# Patient Record
Sex: Female | Born: 1980 | Race: Black or African American | Hispanic: No | Marital: Married | Smoking: Never smoker
Health system: Southern US, Community
[De-identification: ages and names within clinical notes are randomized; demographics above are authoritative.]

## PROBLEM LIST (undated history)

## (undated) DIAGNOSIS — D649 Anemia, unspecified: Secondary | ICD-10-CM

## (undated) HISTORY — DX: Anemia, unspecified: D64.9

## (undated) HISTORY — PX: OTHER SURGICAL HISTORY: SHX169

## (undated) HISTORY — PX: TUBAL LIGATION: SHX77

---

## 2001-08-28 ENCOUNTER — Other Ambulatory Visit: Admission: RE | Admit: 2001-08-28 | Discharge: 2001-08-28 | Payer: Self-pay | Admitting: Gynecology

## 2003-01-08 ENCOUNTER — Other Ambulatory Visit: Admission: RE | Admit: 2003-01-08 | Discharge: 2003-01-08 | Payer: Self-pay | Admitting: Gynecology

## 2003-03-07 ENCOUNTER — Inpatient Hospital Stay (HOSPITAL_COMMUNITY): Admission: AD | Admit: 2003-03-07 | Discharge: 2003-03-07 | Payer: Self-pay | Admitting: Gynecology

## 2003-07-25 ENCOUNTER — Inpatient Hospital Stay (HOSPITAL_COMMUNITY): Admission: AD | Admit: 2003-07-25 | Discharge: 2003-07-29 | Payer: Self-pay | Admitting: Gynecology

## 2003-09-09 ENCOUNTER — Other Ambulatory Visit: Admission: RE | Admit: 2003-09-09 | Discharge: 2003-09-09 | Payer: Self-pay | Admitting: Gynecology

## 2009-10-18 HISTORY — PX: OTHER SURGICAL HISTORY: SHX169

## 2014-03-26 ENCOUNTER — Encounter: Payer: Self-pay | Admitting: Women's Health

## 2014-03-26 ENCOUNTER — Ambulatory Visit (INDEPENDENT_AMBULATORY_CARE_PROVIDER_SITE_OTHER): Payer: BC Managed Care – PPO | Admitting: Women's Health

## 2014-03-26 ENCOUNTER — Other Ambulatory Visit (HOSPITAL_COMMUNITY)
Admission: RE | Admit: 2014-03-26 | Discharge: 2014-03-26 | Disposition: A | Discharge status: Home / Self Care | Payer: BC Managed Care – PPO | Source: Ambulatory Visit | Attending: Women's Health | Admitting: Women's Health

## 2014-03-26 VITALS — BP 130/84 | Ht 65.5 in | Wt 205.0 lb

## 2014-03-26 DIAGNOSIS — Z833 Family history of diabetes mellitus: Secondary | ICD-10-CM

## 2014-03-26 DIAGNOSIS — Z1151 Encounter for screening for human papillomavirus (HPV): Secondary | ICD-10-CM | POA: Insufficient documentation

## 2014-03-26 DIAGNOSIS — A499 Bacterial infection, unspecified: Secondary | ICD-10-CM

## 2014-03-26 DIAGNOSIS — Z01419 Encounter for gynecological examination (general) (routine) without abnormal findings: Secondary | ICD-10-CM

## 2014-03-26 DIAGNOSIS — Z13220 Encounter for screening for lipoid disorders: Secondary | ICD-10-CM

## 2014-03-26 DIAGNOSIS — R87810 Cervical high risk human papillomavirus (HPV) DNA test positive: Secondary | ICD-10-CM | POA: Insufficient documentation

## 2014-03-26 DIAGNOSIS — B373 Candidiasis of vulva and vagina: Secondary | ICD-10-CM

## 2014-03-26 DIAGNOSIS — B3749 Other urogenital candidiasis: Secondary | ICD-10-CM

## 2014-03-26 DIAGNOSIS — B9689 Other specified bacterial agents as the cause of diseases classified elsewhere: Secondary | ICD-10-CM

## 2014-03-26 DIAGNOSIS — Z9851 Tubal ligation status: Secondary | ICD-10-CM

## 2014-03-26 DIAGNOSIS — N760 Acute vaginitis: Secondary | ICD-10-CM

## 2014-03-26 DIAGNOSIS — B3731 Acute candidiasis of vulva and vagina: Secondary | ICD-10-CM

## 2014-03-26 DIAGNOSIS — B3741 Candidal cystitis and urethritis: Secondary | ICD-10-CM

## 2014-03-26 DIAGNOSIS — E079 Disorder of thyroid, unspecified: Secondary | ICD-10-CM

## 2014-03-26 LAB — CBC WITH DIFFERENTIAL/PLATELET
BASOS PCT: 0 % (ref 0–1)
Basophils Absolute: 0 10*3/uL (ref 0.0–0.1)
Eosinophils Absolute: 0.1 10*3/uL (ref 0.0–0.7)
Eosinophils Relative: 1 % (ref 0–5)
HEMATOCRIT: 30.1 % — AB (ref 36.0–46.0)
Hemoglobin: 9.3 g/dL — ABNORMAL LOW (ref 12.0–15.0)
Lymphocytes Relative: 31 % (ref 12–46)
Lymphs Abs: 2.4 10*3/uL (ref 0.7–4.0)
MCH: 20.2 pg — ABNORMAL LOW (ref 26.0–34.0)
MCHC: 30.9 g/dL (ref 30.0–36.0)
MCV: 65.4 fL — ABNORMAL LOW (ref 78.0–100.0)
MONO ABS: 0.5 10*3/uL (ref 0.1–1.0)
Monocytes Relative: 7 % (ref 3–12)
NEUTROS ABS: 4.6 10*3/uL (ref 1.7–7.7)
Neutrophils Relative %: 61 % (ref 43–77)
PLATELETS: 333 10*3/uL (ref 150–400)
RBC: 4.6 MIL/uL (ref 3.87–5.11)
RDW: 20.9 % — AB (ref 11.5–15.5)
WBC: 7.6 10*3/uL (ref 4.0–10.5)

## 2014-03-26 LAB — WET PREP FOR TRICH, YEAST, CLUE: TRICH WET PREP: NONE SEEN

## 2014-03-26 MED ORDER — METRONIDAZOLE 0.75 % VA GEL
VAGINAL | Status: DC
Start: 2014-03-26 — End: 2017-06-16

## 2014-03-26 MED ORDER — FLUCONAZOLE 150 MG PO TABS
150.0000 mg | ORAL_TABLET | Freq: Once | ORAL | Status: DC
Start: 2014-03-26 — End: 2017-06-16

## 2014-03-26 NOTE — Patient Instructions (Signed)

## 2014-03-26 NOTE — Progress Notes (Signed)
Linda Mayo [birth year] [id number]    History:    Presents for annual exam.  Regular monthly cycle/BTL with complaint of increased vaginal discharge. Normal Pap history, no Pap for several years. Mother breast cancer age 32, hypertension.  Past medical history, past surgical history, family history and social history were all reviewed and documented in the EPIC chart. 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 Father diabetes  ROS:  A  12 point ROS was performed and pertinent positives and negatives are included.  Exam:  Filed Vitals:   03/26/14 1626  BP: 130/84    General appearance:  Normal Thyroid:  Symmetrical, normal in size, without palpable masses or nodularity. Respiratory  Auscultation:  Clear without wheezing or rhonchi Cardiovascular  Auscultation:  Regular rate, without rubs, murmurs or gallops  Edema/varicosities:  Not grossly evident Abdominal  Soft,nontender, without masses, guarding or rebound.  Liver/spleen:  No organomegaly noted  Hernia:  None appreciated  Skin  Inspection:  Grossly normal   Breasts: Examined lying and sitting.     Right: Without masses, retractions, discharge or axillary adenopathy.     Left: Without masses, retractions, discharge or axillary adenopathy. Gentitourinary   Inguinal/mons:  Normal without inguinal adenopathy  External genitalia:  Normal  BUS/Urethra/Skene's glands:  Normal  Vagina:  Wet prep positive for amines, clues, TNTC bacteria and few yeast  Cervix:  Normal  Uterus:   normal in size, shape and contour.  Midline and mobile  Adnexa/parametria:     Rt: Without masses or tenderness.   Lt: Without masses or tenderness.  Anus and perineum: Normal  Digital rectal exam: Normal sphincter tone without palpated masses or tenderness  Assessment/Plan:  33 y.o. MBF G2P2 for annual exam with complaint of vaginal discharge.    Bacteria vaginosis Yeast Regular monthly  cycle/BTL Obesity  Plan: MetroGel vaginal cream 1 applicator at bedtime x5, alcohol precautions reviewed, Diflucan 150 by mouth times one dose. Instructed to call if no relief of discharge. SBE's, increase regular exercise and decrease calories for weight loss, continue healthier diet, calcium rich diet encouraged. CBC, glucose,  UA,, Pap with high-risk HPV typing. Last annual exam and Pap 2012/normal.  Note: This dictation was prepared with Dragon/digital dictation.  Any transcriptional errors that result are unintentional. [place], 5:13 PM 03/26/2014

## 2014-03-27 LAB — URINALYSIS W MICROSCOPIC + REFLEX CULTURE
BILIRUBIN URINE: NEGATIVE
Casts: NONE SEEN
Crystals: NONE SEEN
GLUCOSE, UA: NEGATIVE mg/dL
Hgb urine dipstick: NEGATIVE
Ketones, ur: NEGATIVE mg/dL
Nitrite: POSITIVE — AB
PROTEIN: NEGATIVE mg/dL
Specific Gravity, Urine: 1.019 (ref 1.005–1.030)
Squamous Epithelial / LPF: NONE SEEN
UROBILINOGEN UA: 0.2 mg/dL (ref 0.0–1.0)
pH: 5.5 (ref 5.0–8.0)

## 2014-03-27 LAB — TSH: TSH: 1.524 u[IU]/mL (ref 0.350–4.500)

## 2014-03-27 LAB — GLUCOSE, RANDOM: Glucose, Bld: 85 mg/dL (ref 70–99)

## 2014-03-28 LAB — CYTOLOGY - PAP

## 2014-03-29 ENCOUNTER — Other Ambulatory Visit: Payer: Self-pay | Admitting: Women's Health

## 2014-03-29 DIAGNOSIS — D649 Anemia, unspecified: Secondary | ICD-10-CM

## 2014-03-29 LAB — URINE CULTURE: Colony Count: >=100000

## 2014-03-29 MED ORDER — CIPROFLOXACIN HCL 250 MG PO TABS: 250.0000 mg | ORAL_TABLET | Freq: Two times a day (BID) | ORAL | Status: DC

## 2017-03-02 ENCOUNTER — Encounter: Payer: Self-pay | Admitting: Gynecology

## 2017-06-16 ENCOUNTER — Other Ambulatory Visit: Payer: Self-pay | Admitting: Internal Medicine

## 2017-06-16 ENCOUNTER — Ambulatory Visit (INDEPENDENT_AMBULATORY_CARE_PROVIDER_SITE_OTHER): Payer: BC Managed Care – PPO | Admitting: Family Medicine

## 2017-06-16 ENCOUNTER — Encounter: Payer: Self-pay | Admitting: Family Medicine

## 2017-06-16 VITALS — BP 110/70 | HR 80 | Ht 65.0 in | Wt 220.8 lb

## 2017-06-16 DIAGNOSIS — N6320 Unspecified lump in the left breast, unspecified quadrant: Secondary | ICD-10-CM

## 2017-06-16 DIAGNOSIS — Z7689 Persons encountering health services in other specified circumstances: Secondary | ICD-10-CM | POA: Diagnosis not present

## 2017-06-16 NOTE — Progress Notes (Signed)
   Subjective:    Patient ID: Linda Mayo, female    DOB: [birth year], 36 y.o.   MRN: [id number]  HPI Chief Complaint  Patient presents with  . new pt,    new pt, lump in left breast. declines flu shot   She is new to the practice and here to establish care.  Previous medical care: [facility].   Complains of a mass in her left breast that she found 5 days ago. Denies history of breast lumps. Denies redness, swelling, pain, nipple discharge, skin changes.   2 paternal aunts with breast cancer. Both diagnosed in their 45s.   Denies fever, chills, dizziness, unexplained fatigue, unexplained weight loss, chest pain, palpitations, shortness of breath, abdominal pain, N/V/D, urinary symptoms, LE edema.   Reviewed allergies, medications, past medical, surgical, family, and social history.   Review of Systems Pertinent positives and negatives in the history of present illness.     Objective:   Physical Exam BP 110/70   Pulse 80   Ht 5\' 5"  (1.651 m)   Wt 220 lb 12.8 oz (100.2 kg)   LMP 05/31/2017   BMI 36.74 kg/m  Alert and in no distress. Well-appearing.  Pharyngeal area is normal. Neck is supple without adenopathy or thyromegaly. Right breast normal, without palpable masses or tenderness. Left breast with a 2 cm x 1-1.5 cm palpable mass slightly beneath her aerola. No erythema, warmth, tenderness, skin changes, nipple discharge.       Assessment & Plan:  Breast mass, left - Plan: US BREAST LTD UNI LEFT INC AXILLA, MM DIAG BREAST TOMO BILATERAL  Encounter to establish care  Plan to send her for a diagnostic US.  Follow up pending labs.  Will get her medical records from [facility].  She can follow up for a fasting CPE at her convenience.

## 2017-06-17 ENCOUNTER — Ambulatory Visit
Admission: RE | Admit: 2017-06-17 | Discharge: 2017-06-17 | Disposition: A | Discharge status: Home / Self Care | Payer: BC Managed Care – PPO | Source: Ambulatory Visit | Attending: Family Medicine | Admitting: Family Medicine

## 2017-06-17 ENCOUNTER — Other Ambulatory Visit: Payer: Self-pay | Admitting: Family Medicine

## 2017-06-17 DIAGNOSIS — N6310 Unspecified lump in the right breast, unspecified quadrant: Secondary | ICD-10-CM

## 2017-06-17 DIAGNOSIS — N6320 Unspecified lump in the left breast, unspecified quadrant: Secondary | ICD-10-CM

## 2017-06-21 ENCOUNTER — Other Ambulatory Visit: Payer: Self-pay | Admitting: Family Medicine

## 2017-06-21 DIAGNOSIS — N6310 Unspecified lump in the right breast, unspecified quadrant: Secondary | ICD-10-CM

## 2017-06-21 DIAGNOSIS — N6320 Unspecified lump in the left breast, unspecified quadrant: Secondary | ICD-10-CM

## 2017-06-22 ENCOUNTER — Ambulatory Visit
Admission: RE | Admit: 2017-06-22 | Discharge: 2017-06-22 | Disposition: A | Discharge status: Home / Self Care | Payer: BC Managed Care – PPO | Source: Ambulatory Visit | Attending: Family Medicine | Admitting: Family Medicine

## 2017-06-22 DIAGNOSIS — N6310 Unspecified lump in the right breast, unspecified quadrant: Secondary | ICD-10-CM

## 2017-06-22 DIAGNOSIS — N6320 Unspecified lump in the left breast, unspecified quadrant: Secondary | ICD-10-CM

## 2017-06-23 ENCOUNTER — Other Ambulatory Visit: Payer: Self-pay | Admitting: Family Medicine

## 2017-06-23 DIAGNOSIS — Z803 Family history of malignant neoplasm of breast: Secondary | ICD-10-CM

## 2017-06-23 DIAGNOSIS — R922 Inconclusive mammogram: Secondary | ICD-10-CM

## 2017-06-23 DIAGNOSIS — N6310 Unspecified lump in the right breast, unspecified quadrant: Secondary | ICD-10-CM

## 2017-06-23 DIAGNOSIS — R9230 Dense breasts, unspecified: Secondary | ICD-10-CM

## 2017-06-23 DIAGNOSIS — N6320 Unspecified lump in the left breast, unspecified quadrant: Secondary | ICD-10-CM

## 2019-04-17 ENCOUNTER — Other Ambulatory Visit: Payer: Self-pay

## 2019-04-17 ENCOUNTER — Other Ambulatory Visit: Payer: Self-pay | Admitting: Family Medicine

## 2019-04-17 ENCOUNTER — Encounter: Payer: Self-pay | Admitting: Family Medicine

## 2019-04-17 ENCOUNTER — Ambulatory Visit: Payer: BC Managed Care – PPO | Admitting: Family Medicine

## 2019-04-17 VITALS — BP 124/82 | HR 88 | Temp 98.4°F | Ht 65.5 in | Wt 221.0 lb

## 2019-04-17 DIAGNOSIS — Z111 Encounter for screening for respiratory tuberculosis: Secondary | ICD-10-CM

## 2019-04-17 DIAGNOSIS — E669 Obesity, unspecified: Secondary | ICD-10-CM

## 2019-04-17 DIAGNOSIS — Z13220 Encounter for screening for lipoid disorders: Secondary | ICD-10-CM

## 2019-04-17 DIAGNOSIS — N6320 Unspecified lump in the left breast, unspecified quadrant: Secondary | ICD-10-CM

## 2019-04-17 DIAGNOSIS — Z23 Encounter for immunization: Secondary | ICD-10-CM | POA: Diagnosis not present

## 2019-04-17 DIAGNOSIS — Z833 Family history of diabetes mellitus: Secondary | ICD-10-CM

## 2019-04-17 DIAGNOSIS — D649 Anemia, unspecified: Secondary | ICD-10-CM

## 2019-04-17 DIAGNOSIS — Z803 Family history of malignant neoplasm of breast: Secondary | ICD-10-CM

## 2019-04-17 DIAGNOSIS — Z0000 Encounter for general adult medical examination without abnormal findings: Secondary | ICD-10-CM | POA: Diagnosis not present

## 2019-04-17 NOTE — Progress Notes (Signed)
Subjective:    Patient ID: Linda Mayo, female    DOB: January 12, 1981, 38 y.o.   MRN: [id number]  HPI Chief Complaint  Patient presents with  . cpe    cpe, form to be filled out for work. sees obgyn,    She is here for a complete physical exam. Previous medical care: Last CPE: years ago   History of iron deficiency anemia.  She chews ice often and thinks she may still be anemic. Reports regular but heavy bleeding and cramping with her periods.  Denies  dizziness, chest pain, DOE.   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 Has been an asst principal in the past.  Denies smoking, drug use  Alcohol is not often Diet: fairly healthy  Excerise: walking regularly   Immunizations: Tdap unknown. Will check other immunization needs   Health maintenance:  Mammogram: negative breast biopsies in 06/2017. Overdue  Colonoscopy: N/A Last Gynecological Exam: overdue  Last Menstrual cycle: 04/05/19. Heavy periods.  Last Dental Exam: overdue  Last Eye Exam: years ago   Wears seatbelt always, smoke detectors in home and functioning, does not text while driving and feels safe in home environment.   Reviewed allergies, medications, past medical, surgical, family, and social history.   Review of Systems Review of Systems Constitutional: -fever, -chills, -sweats, -unexpected weight change,+fatigue ENT: -runny nose, -ear pain, -sore throat Cardiology:  -chest pain, -palpitations, -edema Respiratory: -cough, -shortness of breath, -wheezing Gastroenterology: -abdominal pain, -nausea, -vomiting, -diarrhea, -constipation  Hematology: -bleeding or bruising problems Musculoskeletal: -arthralgias, -myalgias, -joint swelling, -back pain Ophthalmology: -vision changes Urology: -dysuria, -difficulty urinating, -hematuria, -urinary frequency, -urgency Neurology: -headache, -weakness,  -tingling, -numbness       Objective:   Physical Exam BP 124/82   Pulse 88   Temp 98.4 F (36.9 C) (Oral)   Ht 5' 5.5" (1.664 m)   Wt 221 lb (100.2 kg)   SpO2 99%   BMI 36.22 kg/m   General Appearance:    Alert, cooperative, no distress, appears stated age  Head:    Normocephalic, without obvious abnormality, atraumatic  Eyes:    PERRL, conjunctiva/corneas clear, EOM's intact, fundi    benign  Ears:    Normal TM's and external ear canals  Nose:   Mask in place   Throat:   Mask in place   Neck:   Supple, no lymphadenopathy;  thyroid:  no   enlargement/tenderness/nodules; no carotid   bruit or JVD  Back:    Spine nontender, no curvature, ROM normal, no CVA     tenderness  Lungs:     Clear to auscultation bilaterally without wheezes, rales or     ronchi; respirations unlabored  Chest Wall:    No tenderness or deformity   Heart:    Regular rate and rhythm, S1 and S2 normal, no murmur, rub   or gallop  Breast Exam:    OB/GYN   Abdomen:     Soft, non-tender, nondistended, normoactive bowel sounds,    no masses, no hepatosplenomegaly  Genitalia:    OB/GYN   Rectal:    Not performed due to age<40 and no related complaints  Extremities:   No clubbing, cyanosis or edema  Pulses:   2+ and symmetric all extremities  Skin:   Skin color, texture, turgor normal, no rashes or lesions  Lymph nodes:   Cervical, supraclavicular, and axillary nodes  normal  Neurologic:   CNII-XII intact, normal strength, sensation and gait; reflexes 2+ and symmetric throughout          Psych:   Normal mood, affect, hygiene and grooming.        Assessment & Plan:  Routine general medical examination at a health care facility - Plan: CBC with Differential/Platelet, Comprehensive metabolic panel, TSH, T4, free, T3, she is doing well overall. Discussed safety and health promotion. She is overdue on preventive health care and will call OB/GYN to get updated on pap smear and mammogram.   Need for Tdap vaccination  - Plan: Tdap vaccine greater than or equal to 7yo IM, counseling done on all components of vaccine.   Anemia, unspecified type - Plan: Iron, TIBC and Ferritin Panel, check labs and follow up. Unable to tolerate oral iron due to GI upset per patient. Discussed talking to her OB/GYN about hormonal therapy to help with heavy periods.   Family history of diabetes mellitus - Plan: Hemoglobin A1c, counseling on healthy diet and exercise.   Obesity (BMI 30-39.9) - Plan: Hemoglobin A1c, Lipid panel, counseling on health lifestyle. Discussed potential long term health consequences associated with obesity.   Screening for tuberculosis - Plan: QuantiFERON-TB Gold Plus, she needs this for work.   Screening for lipid disorders - Plan: Lipid panel, counseling on diet and exercise.

## 2019-04-17 NOTE — Patient Instructions (Signed)
It was a pleasure seeing you today.   Call and schedule with your OB/GYN. You are overdue for your pap smear as discussed.  Call and schedule your mammogram.  Call and schedule a dental cleaning.   It is recommended that you get at least 150 minutes of physical activity per week.  Limit carbohydrates (potatoes, rice, pasta, bread) as well as sugary foods and drinks.   We will call you with your lab results.     Preventive Care 38-37 Years Old, Female Preventive care refers to visits with your health care provider and lifestyle choices that can promote health and wellness. This includes:  A yearly physical exam. This may also be called an annual well check.  Regular dental visits and eye exams.  Immunizations.  Screening for certain conditions.  Healthy lifestyle choices, such as eating a healthy diet, getting regular exercise, not using drugs or products that contain nicotine and tobacco, and limiting alcohol use. What can I expect for my preventive care visit? Physical exam Your health care provider will check your:  Height and weight. This may be used to calculate body mass index (BMI), which tells if you are at a healthy weight.  Heart rate and blood pressure.  Skin for abnormal spots. Counseling Your health care provider may ask you questions about your:  Alcohol, tobacco, and drug use.  Emotional well-being.  Home and relationship well-being.  Sexual activity.  Eating habits.  Work and work Statistician.  Method of birth control.  Menstrual cycle.  Pregnancy history. What immunizations do I need?  Influenza (flu) vaccine  This is recommended every year. Tetanus, diphtheria, and pertussis (Tdap) vaccine  You may need a Td booster every 10 years. Varicella (chickenpox) vaccine  You may need this if you have not been vaccinated. Human papillomavirus (HPV) vaccine  If recommended by your health care provider, you may need three doses over 6 months.  Measles, mumps, and rubella (MMR) vaccine  You may need at least one dose of MMR. You may also need a second dose. Meningococcal conjugate (MenACWY) vaccine  One dose is recommended if you are age 70-21 years and a first-year college student living in a residence hall, or if you have one of several medical conditions. You may also need additional booster doses. Pneumococcal conjugate (PCV13) vaccine  You may need this if you have certain conditions and were not previously vaccinated. Pneumococcal polysaccharide (PPSV23) vaccine  You may need one or two doses if you smoke cigarettes or if you have certain conditions. Hepatitis A vaccine  You may need this if you have certain conditions or if you travel or work in places where you may be exposed to hepatitis A. Hepatitis B vaccine  You may need this if you have certain conditions or if you travel or work in places where you may be exposed to hepatitis B. Haemophilus influenzae type b (Hib) vaccine  You may need this if you have certain conditions. You may receive vaccines as individual doses or as more than one vaccine together in one shot (combination vaccines). Talk with your health care provider about the risks and benefits of combination vaccines. What tests do I need?  Blood tests  Lipid and cholesterol levels. These may be checked every 5 years starting at age 66.  Hepatitis C test.  Hepatitis B test. Screening  Diabetes screening. This is done by checking your blood sugar (glucose) after you have not eaten for a while (fasting).  Sexually transmitted disease (STD)  testing.  BRCA-related cancer screening. This may be done if you have a family history of breast, ovarian, tubal, or peritoneal cancers.  Pelvic exam and Pap test. This may be done every 3 years starting at age 63. Starting at age 44, this may be done every 5 years if you have a Pap test in combination with an HPV test. Talk with your health care provider about  your test results, treatment options, and if necessary, the need for more tests. Follow these instructions at home: Eating and drinking   Eat a diet that includes fresh fruits and vegetables, whole grains, lean protein, and low-fat dairy.  Take vitamin and mineral supplements as recommended by your health care provider.  Do not drink alcohol if: ? Your health care provider tells you not to drink. ? You are pregnant, may be pregnant, or are planning to become pregnant.  If you drink alcohol: ? Limit how much you have to 0-1 drink a day. ? Be aware of how much alcohol is in your drink. In the [place] one drink equals one 12 oz bottle of beer (355 mL), one 5 oz glass of wine (148 mL), or one 1 oz glass of hard liquor (44 mL). Lifestyle  Take daily care of your teeth and gums.  Stay active. Exercise for at least 30 minutes on 5 or more days each week.  Do not use any products that contain nicotine or tobacco, such as cigarettes, e-cigarettes, and chewing tobacco. If you need help quitting, ask your health care provider.  If you are sexually active, practice safe sex. Use a condom or other form of birth control (contraception) in order to prevent pregnancy and STIs (sexually transmitted infections). If you plan to become pregnant, see your health care provider for a preconception visit. What's next?  Visit your health care provider once a year for a well check visit.  Ask your health care provider how often you should have your eyes and teeth checked.  Stay up to date on all vaccines. This information is not intended to replace advice given to you by your health care provider. Make sure you discuss any questions you have with your health care provider. Document Released: 11/30/2001 Document Revised: 06/15/2018 Document Reviewed: 06/15/2018 Elsevier Patient Education  2020 [employer].

## 2019-04-18 ENCOUNTER — Other Ambulatory Visit: Payer: Self-pay | Admitting: Family Medicine

## 2019-04-18 DIAGNOSIS — D509 Iron deficiency anemia, unspecified: Secondary | ICD-10-CM

## 2019-04-18 MED ORDER — POLYSACCHARIDE IRON COMPLEX 150 MG PO CAPS: 150.0000 mg | ORAL_CAPSULE | Freq: Every day | ORAL | 2 refills | Status: DC

## 2019-04-19 LAB — LIPID PANEL
Chol/HDL Ratio: 2.6 ratio (ref 0.0–4.4)
Cholesterol, Total: 163 mg/dL (ref 100–199)
HDL: 62 mg/dL (ref >39)
LDL Calculated: 87 mg/dL (ref 0–99)
Triglycerides: 71 mg/dL (ref 0–149)
VLDL Cholesterol Cal: 14 mg/dL (ref 5–40)

## 2019-04-19 LAB — CBC WITH DIFFERENTIAL/PLATELET
Basophils Absolute: 0 10*3/uL (ref 0.0–0.2)
Basos: 0 %
EOS (ABSOLUTE): 0 10*3/uL (ref 0.0–0.4)
Eos: 1 %
Hematocrit: 31.7 % — ABNORMAL LOW (ref 34.0–46.6)
Hemoglobin: 9.6 g/dL — ABNORMAL LOW (ref 11.1–15.9)
Immature Grans (Abs): 0 10*3/uL (ref 0.0–0.1)
Immature Granulocytes: 0 %
Lymphocytes Absolute: 1.6 10*3/uL (ref 0.7–3.1)
Lymphs: 26 %
MCH: 20.4 pg — ABNORMAL LOW (ref 26.6–33.0)
MCHC: 30.3 g/dL — ABNORMAL LOW (ref 31.5–35.7)
MCV: 67 fL — ABNORMAL LOW (ref 79–97)
Monocytes Absolute: 0.5 10*3/uL (ref 0.1–0.9)
Monocytes: 9 %
Neutrophils Absolute: 3.8 10*3/uL (ref 1.4–7.0)
Neutrophils: 64 %
Platelets: 344 10*3/uL (ref 150–450)
RBC: 4.7 x10E6/uL (ref 3.77–5.28)
RDW: 20 % — ABNORMAL HIGH (ref 11.7–15.4)
WBC: 5.9 10*3/uL (ref 3.4–10.8)

## 2019-04-19 LAB — COMPREHENSIVE METABOLIC PANEL
ALT: 14 IU/L (ref 0–32)
AST: 18 IU/L (ref 0–40)
Albumin/Globulin Ratio: 1.3 (ref 1.2–2.2)
Albumin: 4.4 g/dL (ref 3.8–4.8)
Alkaline Phosphatase: 69 IU/L (ref 39–117)
BUN/Creatinine Ratio: 13 (ref 9–23)
BUN: 10 mg/dL (ref 6–20)
Bilirubin Total: <0.2 mg/dL (ref 0.0–1.2)
CO2: 19 mmol/L — ABNORMAL LOW (ref 20–29)
Calcium: 9.8 mg/dL (ref 8.7–10.2)
Chloride: 105 mmol/L (ref 96–106)
Creatinine, Ser: 0.77 mg/dL (ref 0.57–1.00)
GFR calc Af Amer: 115 mL/min/{1.73_m2} (ref >59)
GFR calc non Af Amer: 100 mL/min/{1.73_m2} (ref >59)
Globulin, Total: 3.3 g/dL (ref 1.5–4.5)
Glucose: 92 mg/dL (ref 65–99)
Potassium: 4.7 mmol/L (ref 3.5–5.2)
Sodium: 140 mmol/L (ref 134–144)
Total Protein: 7.7 g/dL (ref 6.0–8.5)

## 2019-04-19 LAB — QUANTIFERON-TB GOLD PLUS
QuantiFERON Mitogen Value: 6.22 IU/mL
QuantiFERON Nil Value: 0.01 IU/mL
QuantiFERON TB1 Ag Value: 0.01 IU/mL
QuantiFERON TB2 Ag Value: 0.01 IU/mL
QuantiFERON-TB Gold Plus: NEGATIVE

## 2019-04-19 LAB — T4, FREE: Free T4: 1.02 ng/dL (ref 0.82–1.77)

## 2019-04-19 LAB — IRON,TIBC AND FERRITIN PANEL
Ferritin: 5 ng/mL — ABNORMAL LOW (ref 15–150)
Iron Saturation: 6 % — CL (ref 15–55)
Iron: 25 ug/dL — ABNORMAL LOW (ref 27–159)
Total Iron Binding Capacity: 436 ug/dL (ref 250–450)
UIBC: 411 ug/dL (ref 131–425)

## 2019-04-19 LAB — T3: T3, Total: 107 ng/dL (ref 71–180)

## 2019-04-19 LAB — HEMOGLOBIN A1C
Est. average glucose Bld gHb Est-mCnc: 123 mg/dL
Hgb A1c MFr Bld: 5.9 % — ABNORMAL HIGH (ref 4.8–5.6)

## 2019-04-19 LAB — TSH: TSH: 1.4 u[IU]/mL (ref 0.450–4.500)

## 2019-04-27 ENCOUNTER — Other Ambulatory Visit: Payer: Self-pay | Admitting: Family Medicine

## 2019-04-27 ENCOUNTER — Ambulatory Visit
Admission: RE | Admit: 2019-04-27 | Discharge: 2019-04-27 | Disposition: A | Discharge status: Home / Self Care | Payer: BC Managed Care – PPO | Source: Ambulatory Visit | Attending: Family Medicine | Admitting: Family Medicine

## 2019-04-27 ENCOUNTER — Other Ambulatory Visit: Payer: Self-pay

## 2019-04-27 DIAGNOSIS — N6310 Unspecified lump in the right breast, unspecified quadrant: Secondary | ICD-10-CM

## 2019-04-27 DIAGNOSIS — N6320 Unspecified lump in the left breast, unspecified quadrant: Secondary | ICD-10-CM

## 2019-04-27 DIAGNOSIS — Z803 Family history of malignant neoplasm of breast: Secondary | ICD-10-CM

## 2019-05-11 ENCOUNTER — Ambulatory Visit: Payer: BC Managed Care – PPO | Admitting: Gynecology

## 2019-05-11 ENCOUNTER — Encounter: Payer: Self-pay | Admitting: Gynecology

## 2019-05-11 ENCOUNTER — Other Ambulatory Visit: Payer: Self-pay

## 2019-05-11 VITALS — BP 130/80 | Ht 65.5 in | Wt 222.0 lb

## 2019-05-11 DIAGNOSIS — Z01419 Encounter for gynecological examination (general) (routine) without abnormal findings: Secondary | ICD-10-CM

## 2019-05-11 DIAGNOSIS — Z803 Family history of malignant neoplasm of breast: Secondary | ICD-10-CM | POA: Diagnosis not present

## 2019-05-11 DIAGNOSIS — Z124 Encounter for screening for malignant neoplasm of cervix: Secondary | ICD-10-CM | POA: Diagnosis not present

## 2019-05-11 DIAGNOSIS — N920 Excessive and frequent menstruation with regular cycle: Secondary | ICD-10-CM | POA: Diagnosis not present

## 2019-05-11 NOTE — Patient Instructions (Signed)
Follow-up for the ultrasound as scheduled. 

## 2019-05-11 NOTE — Progress Notes (Signed)
    Linda Mayo 11-28-1980 [id number]        38 y.o.  G2P2 for annual gynecologic exam.  Has not been in for several years.  Notes that her menses have become heavier.  Recent blood work at her primary physician's office shows hemoglobin of 9.6 with microcytic hypochromic indices.  Iron level was low.  Recently started on iron.  Menses are regular monthly.  BTL contraception  Past medical history,surgical history, problem list, medications, allergies, family history and social history were all reviewed and documented as reviewed in the EPIC chart.  ROS:  Performed with pertinent positives and negatives included in the history, assessment and plan.   Additional significant findings : None   Exam: Wandra Scot assistant Vitals:   05/11/19 1525  BP: 130/80  Weight: 222 lb (100.7 kg)  Height: 5' 5.5" (1.664 m)   Body mass index is 36.38 kg/m.  General appearance:  Normal affect, orientation and appearance. Skin: Grossly normal HEENT: Without gross lesions.  No cervical or supraclavicular adenopathy. Thyroid normal.  Lungs:  Clear without wheezing, rales or rhonchi Cardiac: RR, without RMG Abdominal:  Soft, nontender, without masses, guarding, rebound, organomegaly or hernia Breasts:  Examined lying and sitting without masses, retractions, discharge or axillary adenopathy. Pelvic:  Ext, BUS, Vagina: Normal  Cervix: Normal.  Pap smear/HPV  Uterus: Anteverted, normal size, shape and contour, midline and mobile nontender   Adnexa: Without masses or tenderness    Anus and perineum: Normal   Rectovaginal: Normal sphincter tone without palpated masses or tenderness.    Assessment/Plan:  [name] for annual gynecologic exam.  With regular menses, tubal sterilization  1. Heavy menses with anemia.  Patient had questions about endometrial ablation.  We discussed menorrhagia and possible etiologies to include hormonal, endometriosis/adenomyosis, leiomyoma/endometrial polyps.   Possible treatment options including hysteroscopy with resection of intracavitary defects, hormonal manipulation, Mirena IUD, endometrial ablation, hysterectomy.  We discussed the pros and cons of each choice.  We discussed success and failure rates.  The patient is going to schedule a sonohysterogram for pelvic surveillance and endometrial assessment and then we will further discuss treatment options. 2. Strong family history of breast cancer.  Reports mother at age 62 with early breast cancer.  Sister had some form of breast biopsy but she is unclear whether this was actually cancer or not.  She has 2 paternal aunts with breast cancer.  She recently had mammography her MRI was discussed with her.  I reviewed with her is important to find out her sisters history as to whether this truly was cancer or not.  I reviewed with her about genetic counseling and possible genetic testing.  Also the issue of MRI.  She is going to follow-up after discussion with her sister to formulate a plan.  Breast exam normal today. 3. Pap smear 2015.  Pap smear/HPV today.  No history of significant abnormal Pap smears. 4. Health maintenance.  No routine lab work done as patient does this elsewhere.  Follow-up for sonohysterogram and further discussion about breast management.   Anastasio Auerbach MD, 3:56 PM 05/11/2019

## 2019-05-14 LAB — PAP, TP IMAGING W/ HPV RNA, RFLX HPV TYPE 16,18/45: HPV DNA High Risk: NOT DETECTED

## 2019-07-10 ENCOUNTER — Encounter: Payer: Self-pay | Admitting: Gynecology

## 2019-07-12 ENCOUNTER — Other Ambulatory Visit: Payer: Self-pay

## 2019-07-12 ENCOUNTER — Ambulatory Visit: Payer: BC Managed Care – PPO | Admitting: Gynecology

## 2019-07-12 ENCOUNTER — Other Ambulatory Visit: Payer: Self-pay | Admitting: Gynecology

## 2019-07-12 ENCOUNTER — Encounter: Payer: Self-pay | Admitting: Gynecology

## 2019-07-12 ENCOUNTER — Ambulatory Visit (INDEPENDENT_AMBULATORY_CARE_PROVIDER_SITE_OTHER): Payer: BC Managed Care – PPO

## 2019-07-12 VITALS — BP 126/70

## 2019-07-12 DIAGNOSIS — D219 Benign neoplasm of connective and other soft tissue, unspecified: Secondary | ICD-10-CM

## 2019-07-12 DIAGNOSIS — N920 Excessive and frequent menstruation with regular cycle: Secondary | ICD-10-CM

## 2019-07-12 DIAGNOSIS — D259 Leiomyoma of uterus, unspecified: Secondary | ICD-10-CM

## 2019-07-12 NOTE — Patient Instructions (Signed)
Office will call you with biopsy results 

## 2019-07-12 NOTE — Addendum Note (Signed)
Addended by: Nelva Nay on: 07/12/2019 03:39 PM   Modules accepted: Orders

## 2019-07-12 NOTE — Progress Notes (Signed)
    Linda Mayo 1981-03-23 [id number]        38 y.o.  G2P2 presents for sonohysterogram due to a history of heavy menses and iron deficiency anemia with a hemoglobin of 9.6.  Status post BTL in the past.  Prior C-section for fetal distress with subsequent repeat C-section.  Past medical history,surgical history, problem list, medications, allergies, family history and social history were all reviewed and documented in the EPIC chart.  Directed ROS with pertinent positives and negatives documented in the history of present illness/assessment and plan.  Exam: Ivin Booty assistant Vitals:   07/12/19 1456  BP: 126/70   General appearance:  Normal Abdomen soft nontender without mass guarding rebound Pelvic external BUS vagina normal.  Cervix normal.  Uterus grossly normal midline mobile nontender.  Adnexa without masses or tenderness.  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  Endometrial echo 11 mm.  Questionable submucosal myoma noted.  Right and left ovaries normal.  Cul-de-sac negative.  Sonohysterogram performed, sterile technique, single-tooth tenaculum anterior lip stabilization with subsequent easy catheter introduction with good distention.  No clear endometrial defects noted.  Endometrial sample taken.  Patient tolerated well  Assessment/Plan:  38 y.o. G2P2 with a history of menorrhagia and iron deficiency anemia.  Options for management reviewed to include hormonal manipulation, Mirena IUD trial, endometrial ablation, hysterectomy.  The pros and cons of each choice discussed.  The patient would like to think of her options and will follow-up with her decision.  We will call her with the endometrial biopsy results from the ultrasound.  Anastasio Auerbach MD, 3:17 PM 07/12/2019

## 2019-07-26 ENCOUNTER — Ambulatory Visit: Payer: BC Managed Care – PPO | Admitting: Gynecology

## 2019-07-30 ENCOUNTER — Encounter: Payer: Self-pay | Admitting: Gynecology

## 2019-07-30 ENCOUNTER — Ambulatory Visit (INDEPENDENT_AMBULATORY_CARE_PROVIDER_SITE_OTHER): Payer: BC Managed Care – PPO | Admitting: Gynecology

## 2019-07-30 ENCOUNTER — Other Ambulatory Visit: Payer: Self-pay

## 2019-07-30 VITALS — BP 130/84

## 2019-07-30 DIAGNOSIS — Z30430 Encounter for insertion of intrauterine contraceptive device: Secondary | ICD-10-CM | POA: Diagnosis not present

## 2019-07-30 HISTORY — PX: INTRAUTERINE DEVICE INSERTION: SHX323

## 2019-07-30 NOTE — Patient Instructions (Signed)

## 2019-07-30 NOTE — Progress Notes (Signed)
    NORELY SPOERL 1980-11-03 [id number]        38 y.o.  G2P2  presents for Mirena IUD placement. She has read through the booklet, has no contraindications and signed the consent form. She currently is at the end of a normal menses.  Also uses BTL for contraception.  I reviewed the insertional process with her as well as the risks to include infection, either immediate or long-term, uterine perforation or migration requiring surgery to remove, other complications such as pain, hormonal side effects and possibility of failure with subsequent pregnancy.   Exam with Caryn Bee assistant Vitals:   07/30/19 1508  BP: 130/84    Pelvic: External BUS vagina normal. Cervix normal with scant menses. Uterus generous size midline mobile nontender. Adnexa without masses or tenderness.  Procedure: The cervix was cleansed with Betadine, anterior lip grasped with a single-tooth tenaculum, and initial attempts at sounding felt an obstruction.  The cervix was then gently dilated using a disposable dilator and was subsequently easily sounded..  The Mirena IUD was then placed according to manufacturer's recommendations without difficulty and the strings were trimmed. The patient tolerated well and will follow up in one month for a postinsertional check.  Lot number:  [id number]   Anastasio Auerbach MD, 3:31 PM 07/30/2019

## 2019-08-22 ENCOUNTER — Other Ambulatory Visit: Payer: Self-pay

## 2019-08-24 ENCOUNTER — Ambulatory Visit: Payer: BC Managed Care – PPO | Admitting: Gynecology

## 2019-08-27 ENCOUNTER — Ambulatory Visit: Payer: BC Managed Care – PPO | Admitting: Gynecology

## 2019-09-06 ENCOUNTER — Other Ambulatory Visit: Payer: Self-pay

## 2019-09-07 ENCOUNTER — Ambulatory Visit: Payer: BC Managed Care – PPO | Admitting: Gynecology

## 2019-09-07 ENCOUNTER — Encounter: Payer: Self-pay | Admitting: Gynecology

## 2019-09-07 VITALS — BP 122/76

## 2019-09-07 DIAGNOSIS — Z30431 Encounter for routine checking of intrauterine contraceptive device: Secondary | ICD-10-CM | POA: Diagnosis not present

## 2019-09-07 NOTE — Patient Instructions (Signed)
Follow-up when due for your next annual exam.  Follow-up sooner if any issues with the IUD.

## 2019-09-07 NOTE — Progress Notes (Signed)
    NEELIE MEN 15-Jan-1981 [id number]        38 y.o.  G2P2 presents for follow-up IUD check.  Had Mirena IUD placed 07/30/2019.  Has done well.  Has noticed some spotting on and off over the last 2 weeks.  Past medical history,surgical history, problem list, medications, allergies, family history and social history were all reviewed and documented in the EPIC chart.  Directed ROS with pertinent positives and negatives documented in the history of present illness/assessment and plan.  Exam: Caryn Bee assistant Vitals:   09/07/19 1114  BP: 122/76   General appearance:  Normal Abdomen soft nontender without masses guarding rebound Pelvic external BUS vagina normal with slight staining.  Cervix grossly normal.  IUD string not visualized.  Uterus grossly normal midline mobile nontender.  Adnexa without masses or tenderness.  Under colposcopic guidance using the endocervical speculum the IUD string was visualized within the endocervical canal.  Assessment/Plan:  38 y.o. G2P2 with normal follow-up IUD check.  IUD string visualized using the colposcope in the endocervical canal.  Patient will monitor bleeding for now.  If spotting or irregular bleeding continues she will follow-up otherwise she will follow-up this coming summer when due for her annual exam.    Anastasio Auerbach MD, 11:33 AM 09/07/2019

## 2020-09-19 IMAGING — US ULTRASOUND LEFT BREAST LIMITED
1 series · 7 of 7 positions shown · non-contrast
Comparison: Previous exams which were performed in [REDACTED] and
June 2017.

CLINICAL DATA: 37-year-old with family history of breast cancer in
her mother diagnosed at age 60 and in 2 paternal aunts diagnosed
around age 50. Patient had benign core needle biopsies of both
breasts in June 2017, pathology revealing duct ectasia. She
returns now for recommended follow-up.

EXAM:
DIGITAL DIAGNOSTIC BILATERAL MAMMOGRAM WITH CAD AND TOMO
Limited ULTRASOUND BILATERAL BREASTS

[Series 1: ultrasound left breast limited · 0.07mm/px · 7 of 7 slices shown]
[im 1/7]
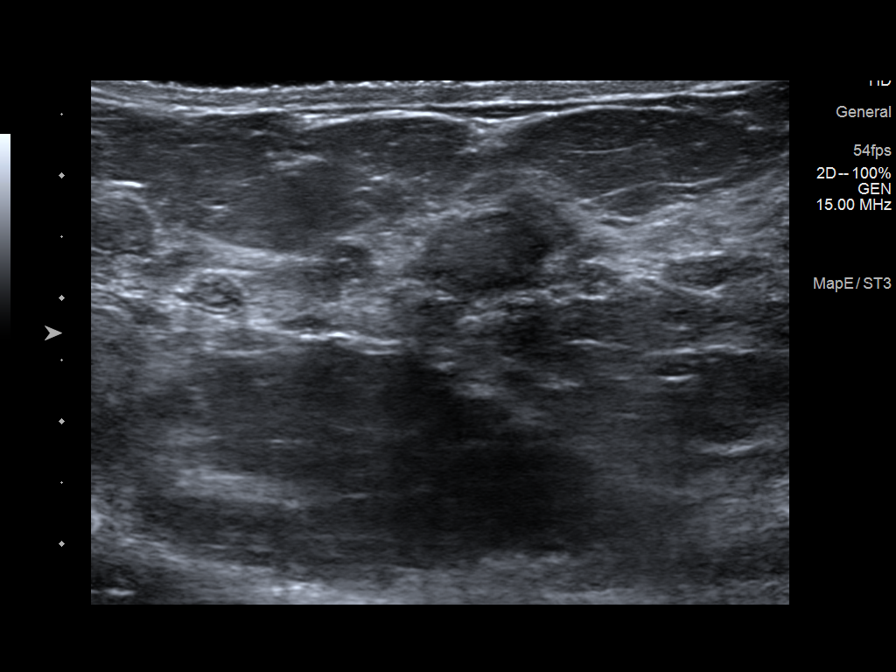
[im 2/7]
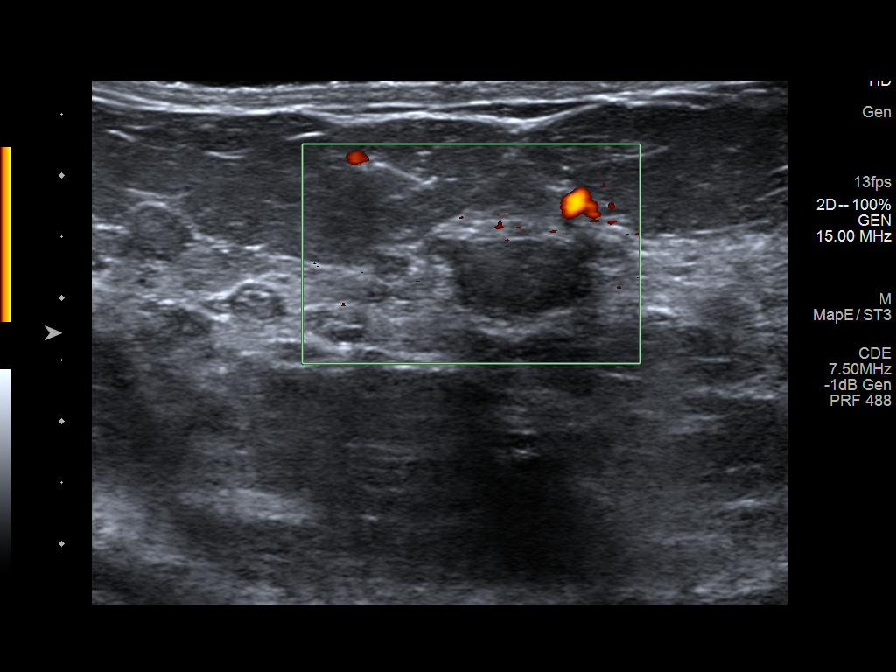
[im 3/7]
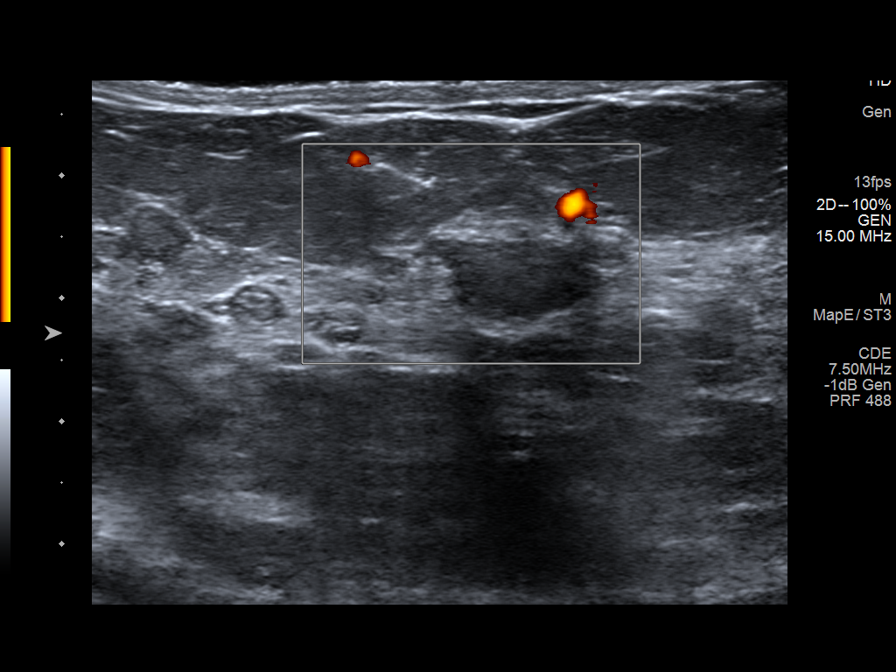
[im 4/7]
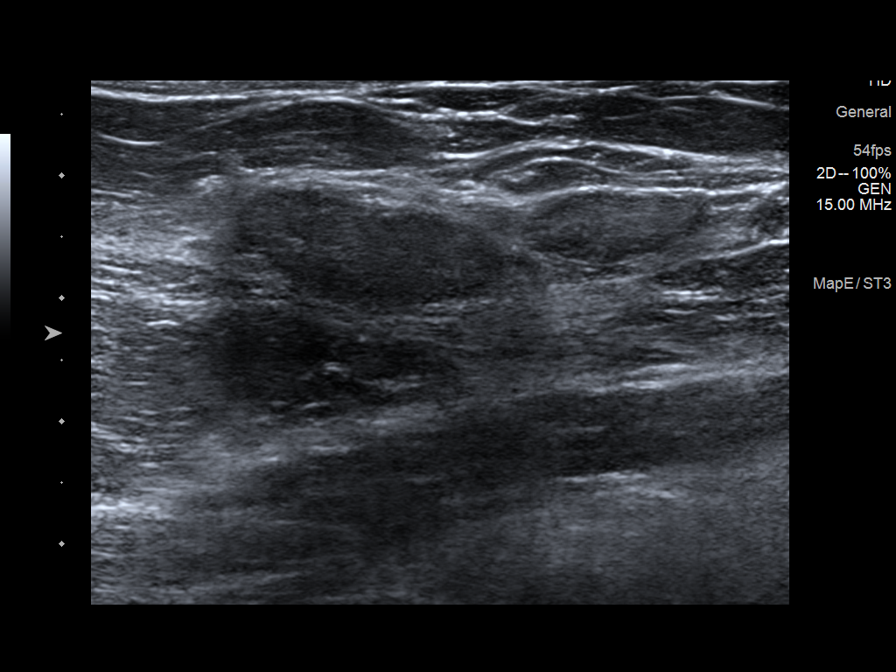
[im 5/7]
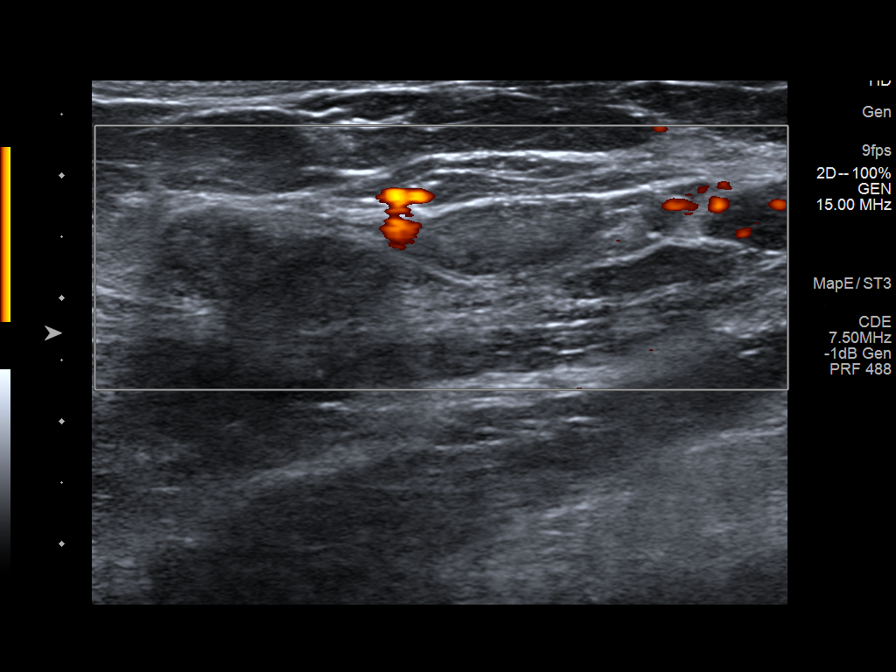
[im 6/7]
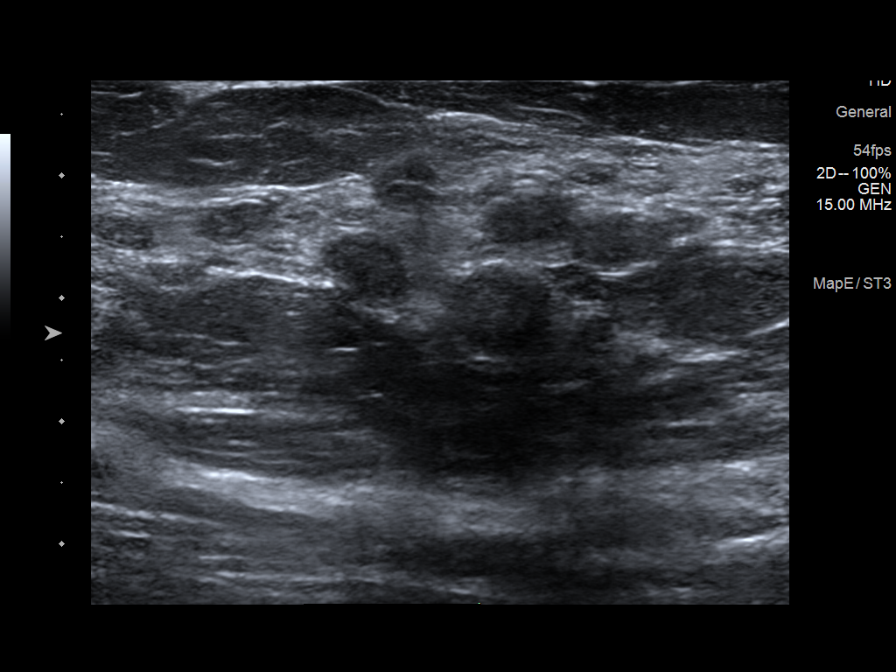
[im 7/7]
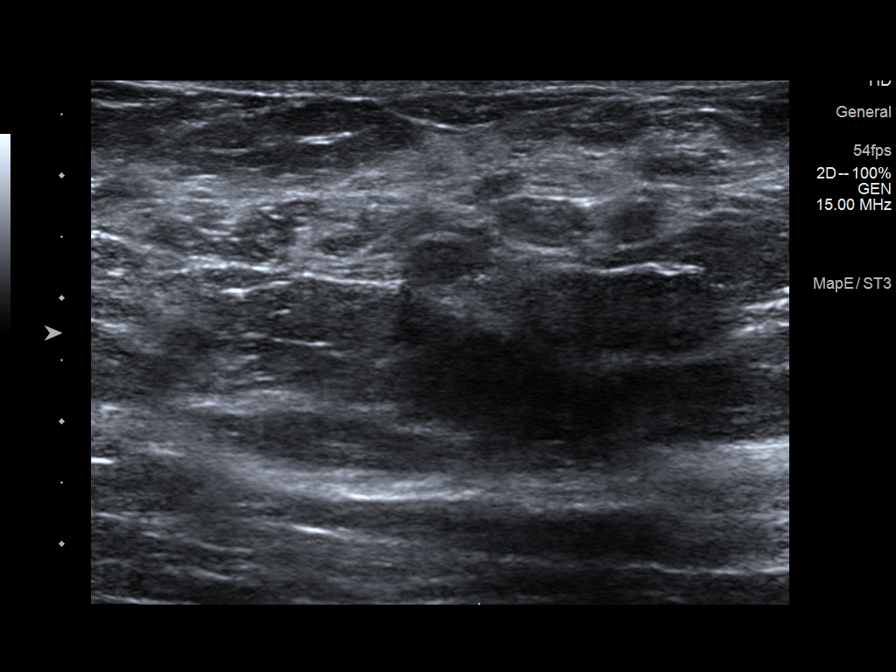

[7 of 7 positions shown; findings below may reference images not displayed]

ACR Breast Density Category c: The breast tissue is heterogeneously
dense, which may obscure small masses.
FINDINGS: Tomosynthesis and synthesized full field CC and MLO views of both
breasts were obtained.

The extensive duct ectasia involving the RIGHT breast at ANTERIOR to
MIDDLE depth is unchanged. The ectatic duct at the site of the prior
biopsy, demarcated by a ribbon clip, is less conspicuous on the
current mammogram. No new or suspicious findings elsewhere in the
RIGHT breast.

The markedly dilated ducts in the OUTER LEFT breast extending from
ANTERIOR to POSTERIOR depth are unchanged. The ectatic duct at the
site of prior biopsy, demarcated by a coil shaped clip, is
unchanged. No new or suspicious findings elsewhere in the LEFT
breast.

Mammographic images were processed with CAD.

Targeted RIGHT breast ultrasound is performed, showing duct ectasia
involving the entire subareolar location. Echogenic material is
present within the dilated ducts, though I do not identify power
Doppler flow within the ducts.

Targeted LEFT breast ultrasound is performed, showing marked duct
ectasia involving the OUTER breast from 12 o'clock through 3
o'clock, with the largest dilated duct at the 3 o'clock location.
Echogenic material is also present within these dilated ducts,
though again, I do not identify a power Doppler flow within the
ducts.
IMPRESSION: Likely benign extensive duct ectasia involving the entire subareolar
RIGHT breast and the OUTER LEFT breast.

RECOMMENDATION:
1. Diagnostic BILATERAL mammography in 1 year.
2. Given the patient's family history of breast cancer and the
extensive duct ectasia containing echogenic material, BILATERAL
Breast MRI without and with contrast is recommended in further
evaluation to exclude DCIS.

I have discussed the findings and recommendations with the patient.
If applicable, a reminder letter will be sent to the patient
regarding the next appointment.

BI-RADS CATEGORY  3: Probably benign.

## 2020-09-19 IMAGING — MG DIGITAL DIAGNOSTIC BILATERAL MAMMOGRAM WITH TOMO AND CAD
8 series · 8 of 24 positions shown · non-contrast
Comparison: Previous exams which were performed in [REDACTED] and
June 2017.

CLINICAL DATA: 37-year-old with family history of breast cancer in
her mother diagnosed at age 60 and in 2 paternal aunts diagnosed
around age 50. Patient had benign core needle biopsies of both
breasts in June 2017, pathology revealing duct ectasia. She
returns now for recommended follow-up.

EXAM:
DIGITAL DIAGNOSTIC BILATERAL MAMMOGRAM WITH CAD AND TOMO
Limited ULTRASOUND BILATERAL BREASTS

[L MLO synth-2D]
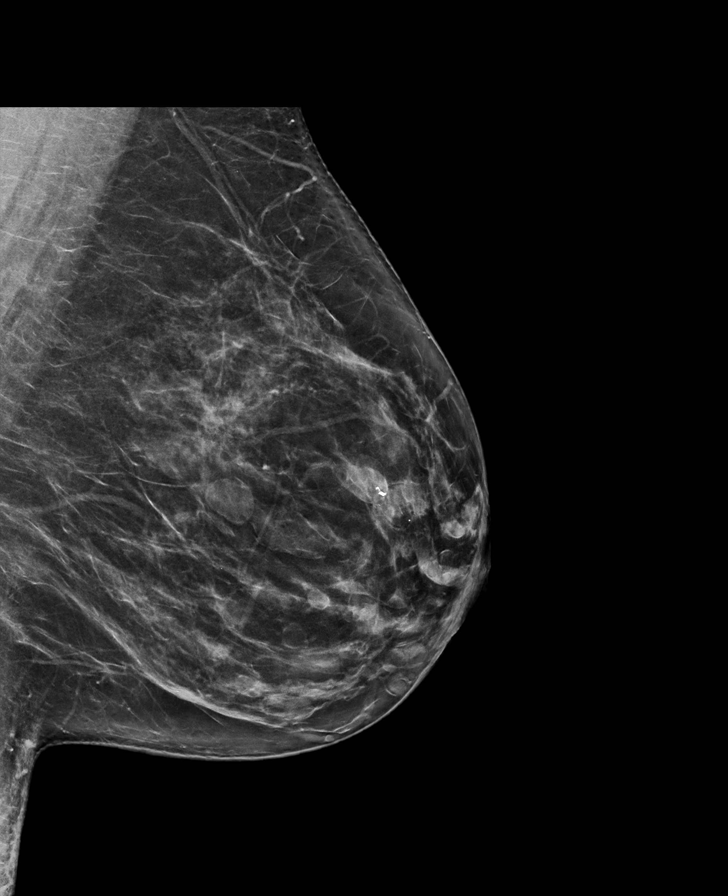

[R CC synth-2D]
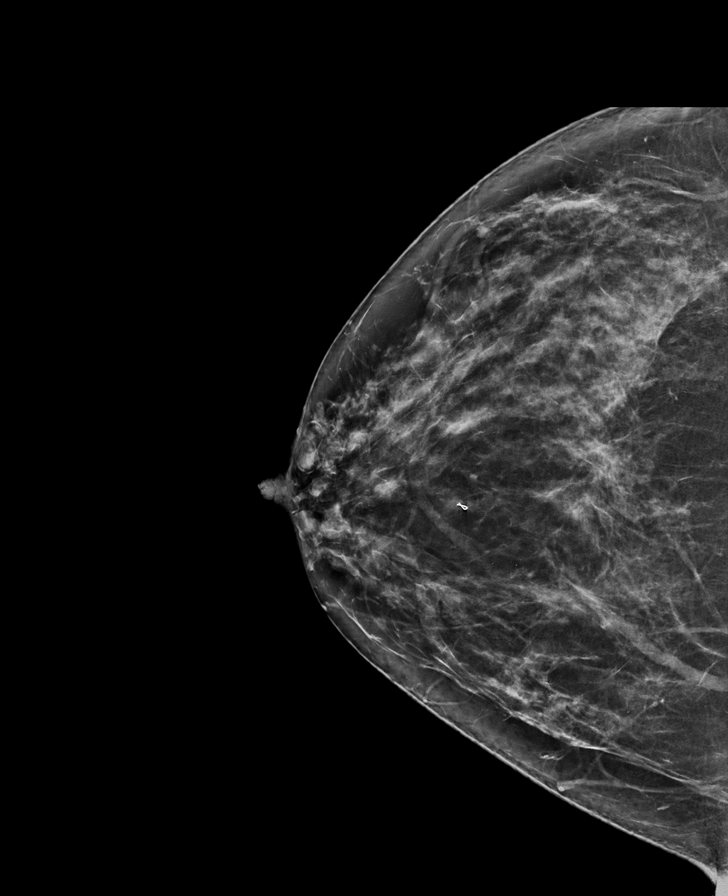

[L CC synth-2D]
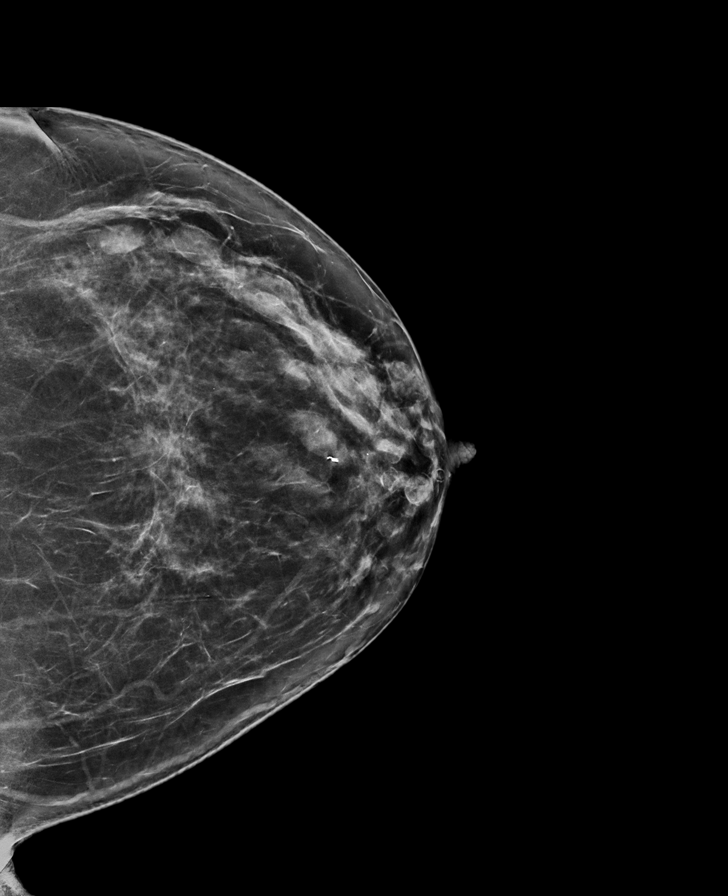

[R MLO synth-2D]
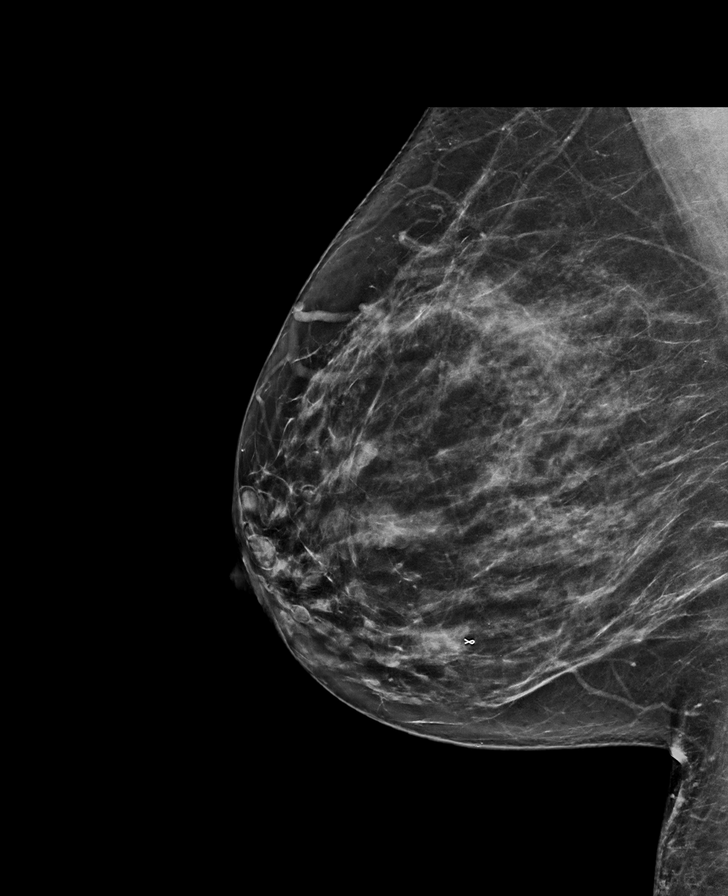

[L MLO tomo · tomo slice 41/80.0]
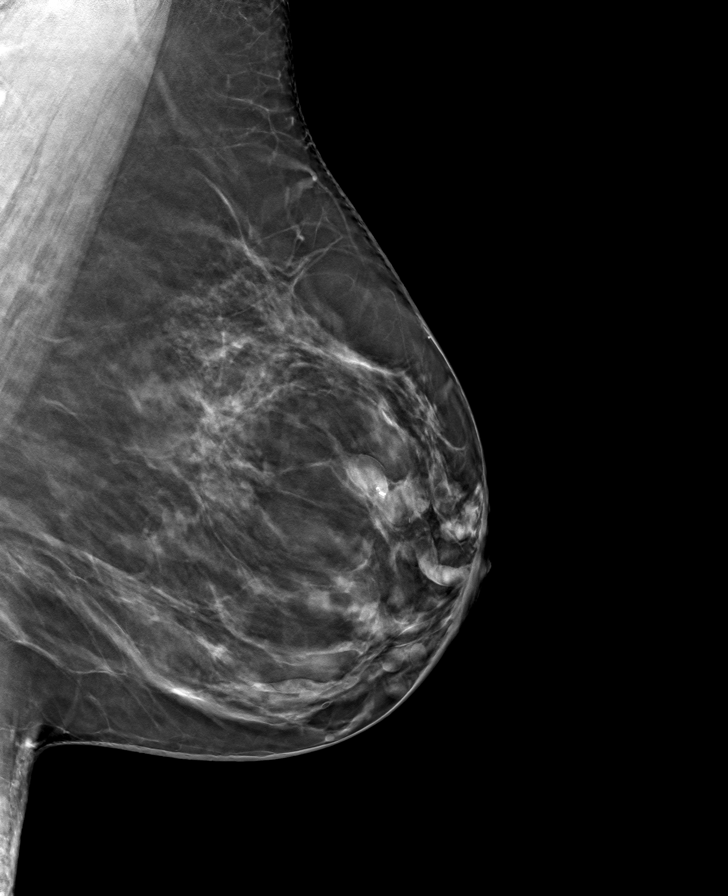

[R MLO tomo · tomo slice 40/79.0]
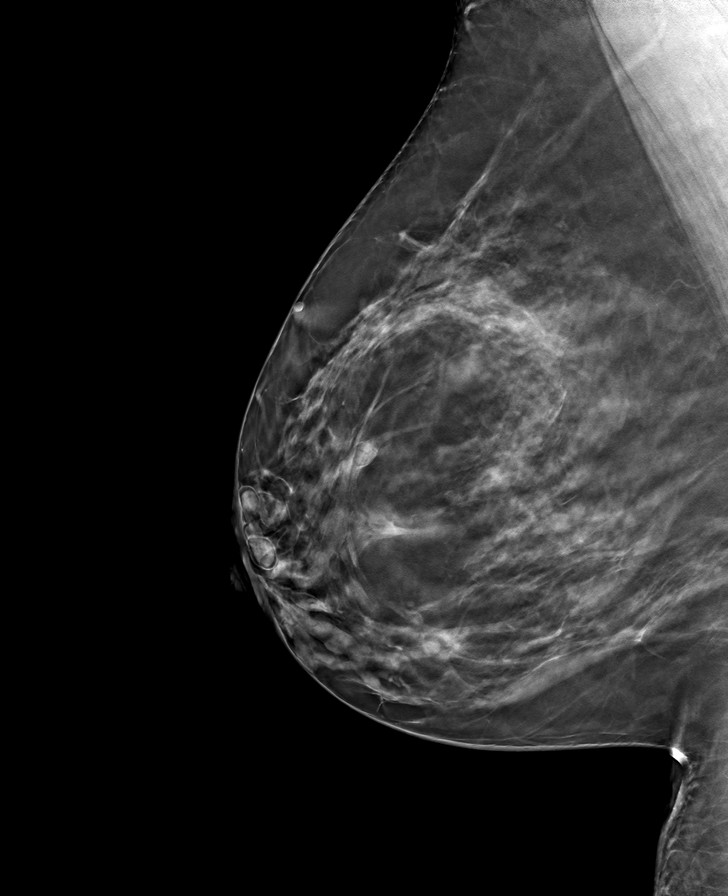

[R CC tomo · tomo slice 39/77.0]
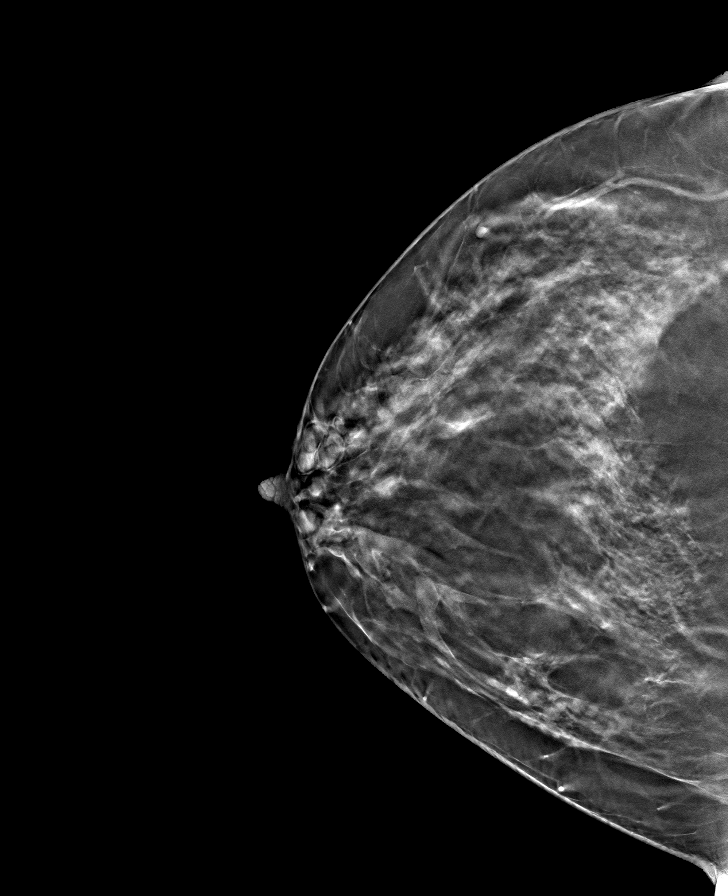

[L CC tomo · tomo slice 41/80.0]
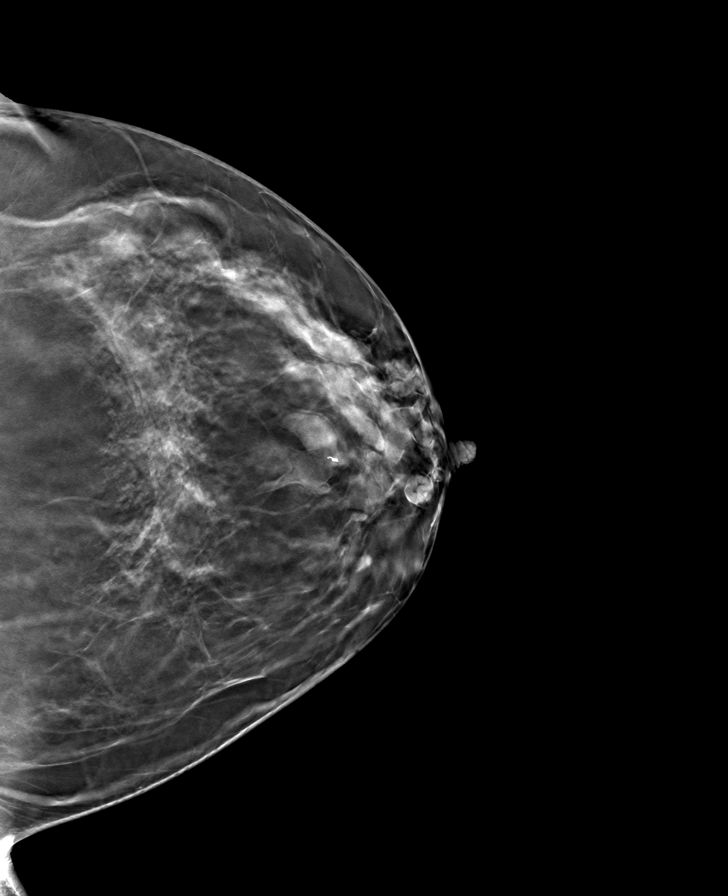

[8 of 24 positions shown; findings below may reference images not displayed]

ACR Breast Density Category c: The breast tissue is heterogeneously
dense, which may obscure small masses.
FINDINGS: Tomosynthesis and synthesized full field CC and MLO views of both
breasts were obtained.

The extensive duct ectasia involving the RIGHT breast at ANTERIOR to
MIDDLE depth is unchanged. The ectatic duct at the site of the prior
biopsy, demarcated by a ribbon clip, is less conspicuous on the
current mammogram. No new or suspicious findings elsewhere in the
RIGHT breast.

The markedly dilated ducts in the OUTER LEFT breast extending from
ANTERIOR to POSTERIOR depth are unchanged. The ectatic duct at the
site of prior biopsy, demarcated by a coil shaped clip, is
unchanged. No new or suspicious findings elsewhere in the LEFT
breast.

Mammographic images were processed with CAD.

Targeted RIGHT breast ultrasound is performed, showing duct ectasia
involving the entire subareolar location. Echogenic material is
present within the dilated ducts, though I do not identify power
Doppler flow within the ducts.

Targeted LEFT breast ultrasound is performed, showing marked duct
ectasia involving the OUTER breast from 12 o'clock through 3
o'clock, with the largest dilated duct at the 3 o'clock location.
Echogenic material is also present within these dilated ducts,
though again, I do not identify a power Doppler flow within the
ducts.
IMPRESSION: Likely benign extensive duct ectasia involving the entire subareolar
RIGHT breast and the OUTER LEFT breast.

RECOMMENDATION:
1. Diagnostic BILATERAL mammography in 1 year.
2. Given the patient's family history of breast cancer and the
extensive duct ectasia containing echogenic material, BILATERAL
Breast MRI without and with contrast is recommended in further
evaluation to exclude DCIS.

I have discussed the findings and recommendations with the patient.
If applicable, a reminder letter will be sent to the patient
regarding the next appointment.

BI-RADS CATEGORY  3: Probably benign.

## 2020-09-19 IMAGING — US ULTRASOUND RIGHT BREAST LIMITED
1 series · 7 of 7 positions shown · non-contrast
Comparison: Previous exams which were performed in [REDACTED] and
June 2017.

CLINICAL DATA: 37-year-old with family history of breast cancer in
her mother diagnosed at age 60 and in 2 paternal aunts diagnosed
around age 50. Patient had benign core needle biopsies of both
breasts in June 2017, pathology revealing duct ectasia. She
returns now for recommended follow-up.

EXAM:
DIGITAL DIAGNOSTIC BILATERAL MAMMOGRAM WITH CAD AND TOMO
Limited ULTRASOUND BILATERAL BREASTS

[Series 1: ultrasound right breast limited · 0.07mm/px · 7 of 7 slices shown]
[im 1/7]
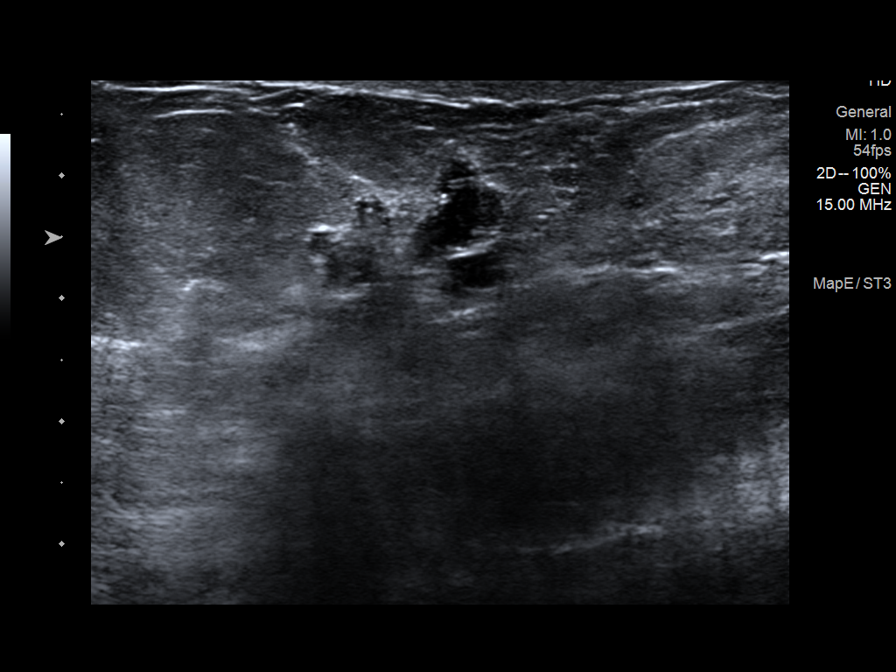
[im 2/7]
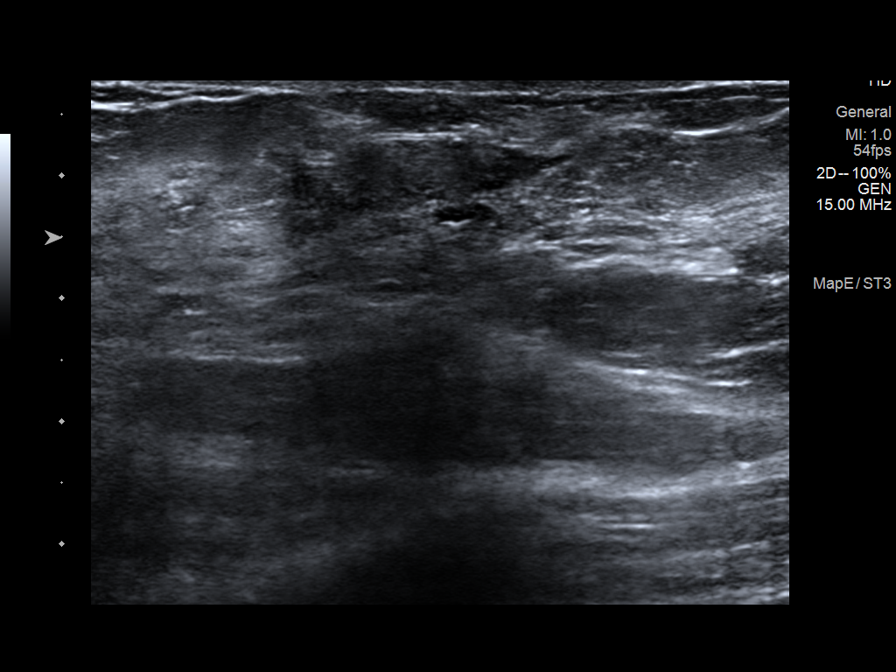
[im 3/7]
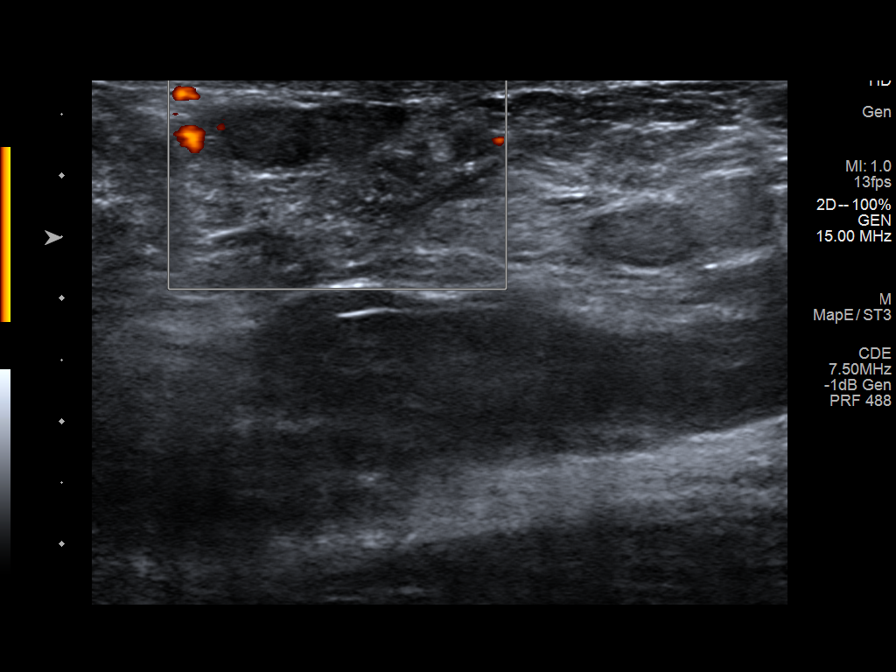
[im 4/7]
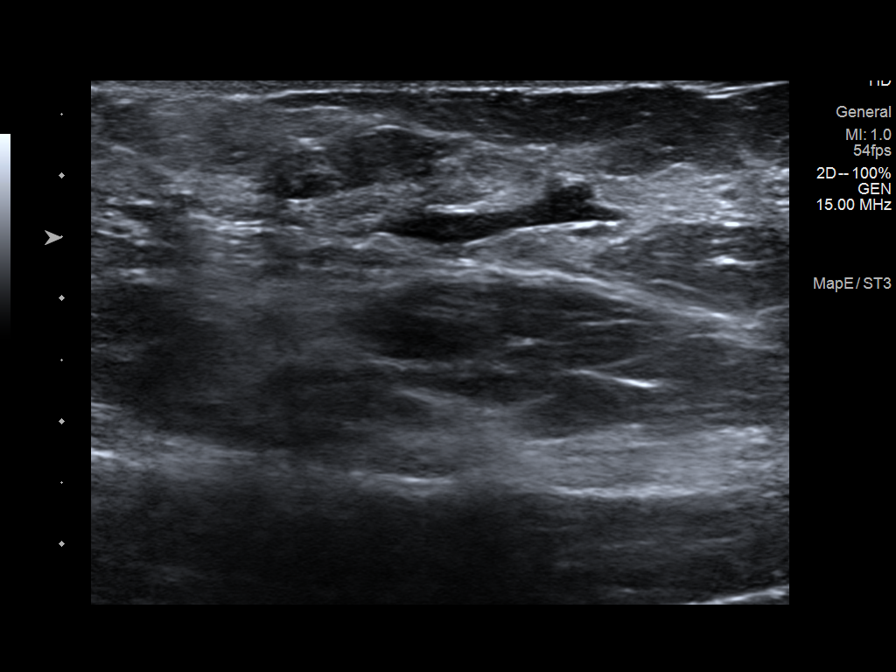
[im 5/7]
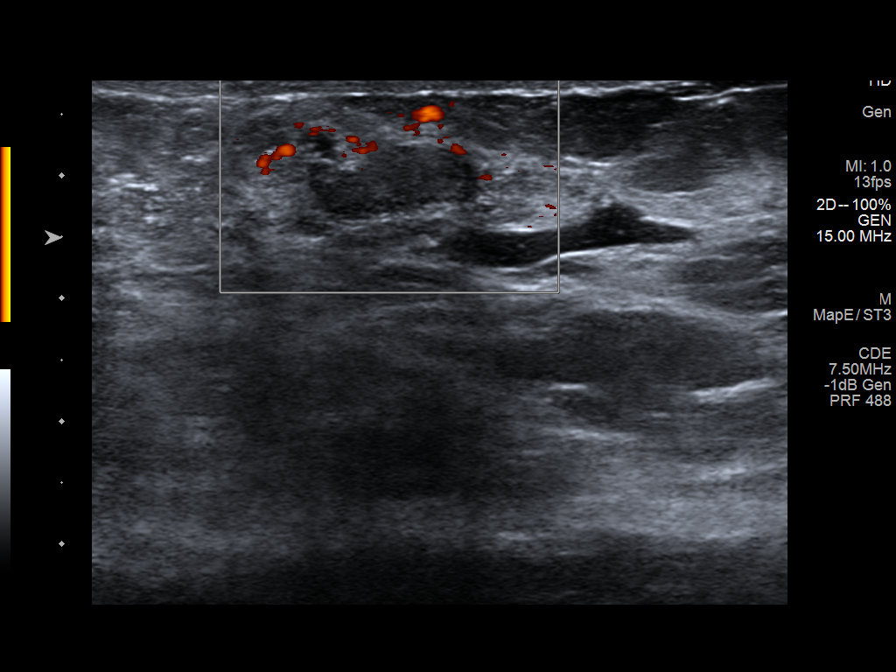
[im 6/7]
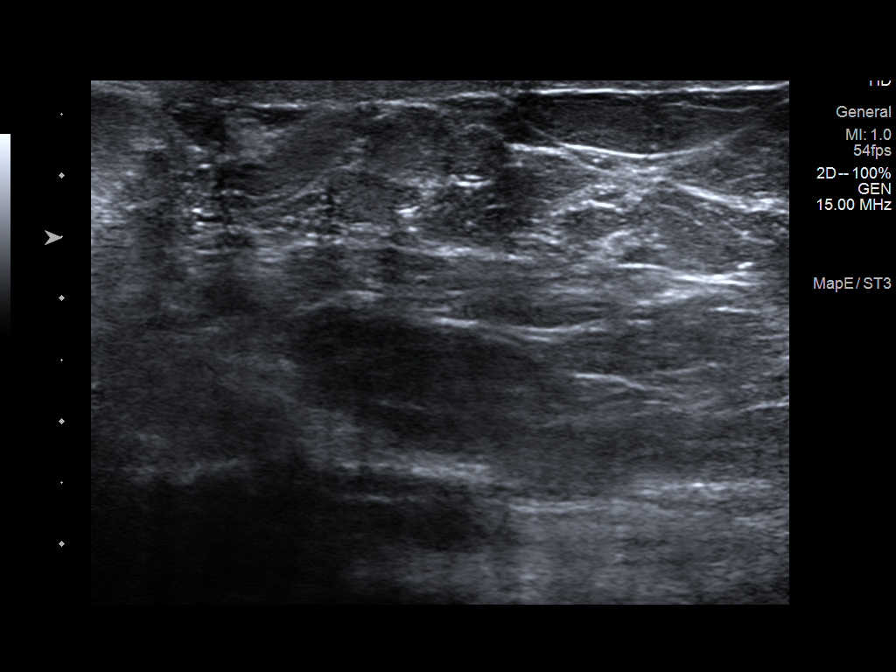
[im 7/7]
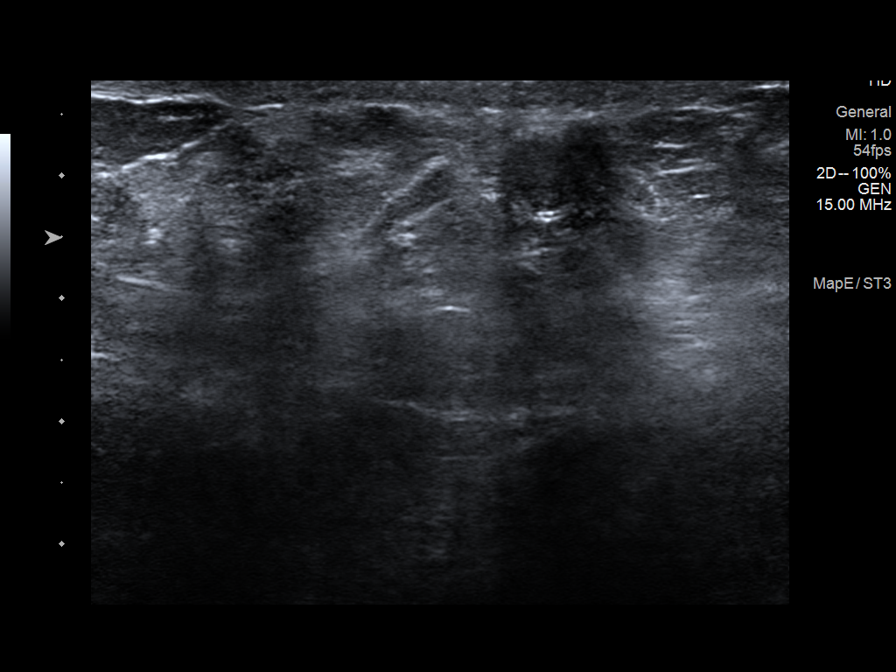

[7 of 7 positions shown; findings below may reference images not displayed]

ACR Breast Density Category c: The breast tissue is heterogeneously
dense, which may obscure small masses.
FINDINGS: Tomosynthesis and synthesized full field CC and MLO views of both
breasts were obtained.

The extensive duct ectasia involving the RIGHT breast at ANTERIOR to
MIDDLE depth is unchanged. The ectatic duct at the site of the prior
biopsy, demarcated by a ribbon clip, is less conspicuous on the
current mammogram. No new or suspicious findings elsewhere in the
RIGHT breast.

The markedly dilated ducts in the OUTER LEFT breast extending from
ANTERIOR to POSTERIOR depth are unchanged. The ectatic duct at the
site of prior biopsy, demarcated by a coil shaped clip, is
unchanged. No new or suspicious findings elsewhere in the LEFT
breast.

Mammographic images were processed with CAD.

Targeted RIGHT breast ultrasound is performed, showing duct ectasia
involving the entire subareolar location. Echogenic material is
present within the dilated ducts, though I do not identify power
Doppler flow within the ducts.

Targeted LEFT breast ultrasound is performed, showing marked duct
ectasia involving the OUTER breast from 12 o'clock through 3
o'clock, with the largest dilated duct at the 3 o'clock location.
Echogenic material is also present within these dilated ducts,
though again, I do not identify a power Doppler flow within the
ducts.
IMPRESSION: Likely benign extensive duct ectasia involving the entire subareolar
RIGHT breast and the OUTER LEFT breast.

RECOMMENDATION:
1. Diagnostic BILATERAL mammography in 1 year.
2. Given the patient's family history of breast cancer and the
extensive duct ectasia containing echogenic material, BILATERAL
Breast MRI without and with contrast is recommended in further
evaluation to exclude DCIS.

I have discussed the findings and recommendations with the patient.
If applicable, a reminder letter will be sent to the patient
regarding the next appointment.

BI-RADS CATEGORY  3: Probably benign.

## 2020-10-27 ENCOUNTER — Telehealth: Payer: BC Managed Care – PPO | Admitting: Family Medicine

## 2020-10-27 ENCOUNTER — Encounter: Payer: Self-pay | Admitting: Family Medicine

## 2020-10-27 ENCOUNTER — Other Ambulatory Visit: Payer: Self-pay

## 2020-10-27 ENCOUNTER — Other Ambulatory Visit: Payer: BC Managed Care – PPO

## 2020-10-27 VITALS — Wt 230.0 lb

## 2020-10-27 DIAGNOSIS — J029 Acute pharyngitis, unspecified: Secondary | ICD-10-CM

## 2020-10-27 DIAGNOSIS — R509 Fever, unspecified: Secondary | ICD-10-CM | POA: Diagnosis not present

## 2020-10-27 DIAGNOSIS — R52 Pain, unspecified: Secondary | ICD-10-CM | POA: Diagnosis not present

## 2020-10-27 DIAGNOSIS — R059 Cough, unspecified: Secondary | ICD-10-CM

## 2020-10-27 LAB — POC COVID19 BINAXNOW: SARS Coronavirus 2 Ag: NEGATIVE

## 2020-10-27 LAB — POC INFLUENZA A&B (BINAX/QUICKVUE)
Influenza A, POC: NEGATIVE
Influenza B, POC: NEGATIVE

## 2020-10-27 NOTE — Progress Notes (Signed)
Her rapid Covid and flu tests are negative. We need to wait and see what her PCR Covid test shows.

## 2020-10-27 NOTE — Progress Notes (Signed)
   Subjective:  Documentation for virtual audio and video telecommunications through [place] encounter:  The patient was located at home. 2 patient identifiers used.  The provider was located in the office. The patient did consent to this visit and is aware of possible charges through their insurance for this visit.  The other persons participating in this telemedicine service were none. Time spent on call was 14 minutes and in review of previous records >20 minutes total.  This virtual service is not related to other E/M service within previous 7 days.   Patient ID: Linda Mayo, female    DOB: Apr 21, 1981, 40 y.o.   MRN: [id number]  HPI Chief Complaint  Patient presents with  . possible covid    Possible covid- sore throat, coughing, chills and body aches. Symptoms started last night.    Complains of symptoms that began last night including fever, chills, body aches, sore throat and dry cough.  No dizziness, chest pain, shortness of breath, abdominal pain, N/V/D  Taking Theraflu.   She has an IUD.   She works as a Pharmacist, hospital for [employer].   Received Pfizer vaccines but no boosters.  Did not get her flu shot.    Reviewed allergies, medications, past medical, surgical, family, and social history.     Review of Systems Pertinent positives and negatives in the history of present illness.     Objective:   Physical Exam Wt 230 lb (104.3 kg)   BMI 37.69 kg/m   Alert and oriented and in no acute distress.  Speaking in complete sentences without difficulty.      Assessment & Plan:  Fever and chills - Plan: POC Influenza A&B(BINAX/QUICKVUE), POC COVID-19 BinaxNow, Novel Coronavirus, NAA ([facility])  Body aches - Plan: POC Influenza A&B(BINAX/QUICKVUE), POC COVID-19 BinaxNow, Novel Coronavirus, NAA ([facility])  Cough - Plan: POC Influenza A&B(BINAX/QUICKVUE), POC COVID-19 BinaxNow, Novel Coronavirus, NAA ([facility])  Acute pharyngitis, unspecified etiology - Plan: POC  Influenza A&B(BINAX/QUICKVUE), POC COVID-19 BinaxNow, Novel Coronavirus, NAA ([facility])  Acute onset of symptoms last night.  Discussed supportive care.  She will come to the office parking lot for testing for Covid and flu.  Advised to quarantine and discussed how to do this in her home with her family members as well as in the community.  Follow-up pending results.  She is aware that she should follow-up with me if she is worsening or has any questions.

## 2020-10-28 LAB — SARS-COV-2, NAA 2 DAY TAT

## 2020-10-28 LAB — NOVEL CORONAVIRUS, NAA: SARS-CoV-2, NAA: DETECTED — AB

## 2020-10-28 NOTE — Progress Notes (Signed)
Her PCR Covid test is positive. She should quarantine for last least 5 days from onset of symptoms and if mostly recovered or back to baseline by then, she can get out of quarantine. If she is still having symptoms especially fever, she will need to quarantine the full 10 days. Based on her symptoms starting on Sunday (day 0) she may get out of quarantine on Saturday the 15 th at the earliest. She will need to wear a mask everywhere for an additional 5 days after that

## 2020-10-29 NOTE — Progress Notes (Signed)
Are you sending her the results?

## 2024-03-20 ENCOUNTER — Other Ambulatory Visit: Payer: Self-pay | Admitting: Family Medicine

## 2024-03-20 DIAGNOSIS — Z803 Family history of malignant neoplasm of breast: Secondary | ICD-10-CM

## 2024-03-20 DIAGNOSIS — N6049 Mammary duct ectasia of unspecified breast: Secondary | ICD-10-CM

## 2024-03-20 DIAGNOSIS — Z1231 Encounter for screening mammogram for malignant neoplasm of breast: Secondary | ICD-10-CM

## 2024-04-30 ENCOUNTER — Ambulatory Visit
Admission: RE | Admit: 2024-04-30 | Discharge: 2024-04-30 | Disposition: A | Discharge status: Home / Self Care | Source: Ambulatory Visit | Attending: Family Medicine | Admitting: Family Medicine

## 2024-04-30 ENCOUNTER — Ambulatory Visit
Admission: RE | Admit: 2024-04-30 | Discharge: 2024-04-30 | Disposition: A | Discharge status: Home / Self Care | Payer: Self-pay | Source: Ambulatory Visit | Attending: Family Medicine | Admitting: Family Medicine

## 2024-04-30 ENCOUNTER — Other Ambulatory Visit: Payer: Self-pay | Admitting: Family Medicine

## 2024-04-30 DIAGNOSIS — N6049 Mammary duct ectasia of unspecified breast: Secondary | ICD-10-CM

## 2024-04-30 DIAGNOSIS — Z803 Family history of malignant neoplasm of breast: Secondary | ICD-10-CM

## 2024-05-21 ENCOUNTER — Ambulatory Visit (INDEPENDENT_AMBULATORY_CARE_PROVIDER_SITE_OTHER): Admitting: Obstetrics and Gynecology

## 2024-05-21 ENCOUNTER — Encounter: Payer: Self-pay | Admitting: Obstetrics and Gynecology

## 2024-05-21 ENCOUNTER — Other Ambulatory Visit (HOSPITAL_COMMUNITY)
Admission: RE | Admit: 2024-05-21 | Discharge: 2024-05-21 | Disposition: A | Discharge status: Home / Self Care | Source: Ambulatory Visit | Attending: Obstetrics and Gynecology | Admitting: Obstetrics and Gynecology

## 2024-05-21 VITALS — BP 122/80 | HR 107 | Temp 98.1°F | Ht 67.0 in | Wt 228.0 lb

## 2024-05-21 DIAGNOSIS — Z01419 Encounter for gynecological examination (general) (routine) without abnormal findings: Secondary | ICD-10-CM | POA: Diagnosis not present

## 2024-05-21 DIAGNOSIS — Z1331 Encounter for screening for depression: Secondary | ICD-10-CM

## 2024-05-21 DIAGNOSIS — Z803 Family history of malignant neoplasm of breast: Secondary | ICD-10-CM | POA: Insufficient documentation

## 2024-05-21 DIAGNOSIS — Z975 Presence of (intrauterine) contraceptive device: Secondary | ICD-10-CM

## 2024-05-21 DIAGNOSIS — Z124 Encounter for screening for malignant neoplasm of cervix: Secondary | ICD-10-CM

## 2024-05-21 NOTE — Assessment & Plan Note (Signed)
 Cervical cancer screening performed according to ASCCP guidelines. Encouraged annual mammogram screening Colonoscopy N/A  Labs and immunizations with her primary Encouraged safe sexual practices as indicated Encouraged healthy lifestyle practices with diet and exercise For patients under 42yo, I recommend 1000mg  calcium daily and 600IU of vitamin D  daily.

## 2024-05-21 NOTE — Progress Notes (Signed)
 43 y.o. G2P2 female s/p BTL with Mirena  IUD (inserted 07/30/19 for AUB, nonvisualized strings- seen with colposcope 2020) here for annual exam. Married. Principal.  Patient's last menstrual period was 05/12/2024 (approximate). Period Cycle (Days): 28 Period Duration (Days): 3-4 Period Pattern: Regular Menstrual Flow: Moderate, Heavy Menstrual Control: Tampon Menstrual Control Change Freq (Hours): 3-4 Dysmenorrhea: (!) Mild Dysmenorrhea Symptoms: Cramping  She would like to discuss removing IUD Mirena  due to expiration. Originally placed for dysmenorrhea. Symptoms are well controlled. Heavier period every other month. Did not tolerate PO iron  for IDA int he past.  Urine sample provided: Yes  Abnormal bleeding: none Pelvic discharge or pain: none Breast mass, nipple discharge or skin changes : none  Sexually active: No Birth control: IUD Last PAP: 03/26/14 Last mammogram: 04/30/24 Bi-Rads 2, C  Exercising: Yes, biking Smoker: No  Flowsheet Row Office Visit from [facility]/21/2024 in [facility]  PHQ-2 Total Score 0       GYN HISTORY: History of bilateral breast biopsies, benign, 2018  OB History  Gravida Para Term Preterm AB Living  2 2    2   SAB IAB Ectopic Multiple Live Births          # Outcome Date GA Lbr Len/2nd Weight Sex Type Anes PTL Lv  2 Para           1 Para            Past Medical History:  Diagnosis Date   Anemia    Past Surgical History:  Procedure Laterality Date   CESAREAN SECTION     INTRAUTERINE DEVICE INSERTION  07/30/2019   Mirena    TUBAL LIGATION     Current Outpatient Medications on File Prior to Visit  Medication Sig Dispense Refill   levonorgestrel  (MIRENA ) 20 MCG/24HR IUD 1 each by Intrauterine route once.     No current facility-administered medications on file prior to visit.   Social History   Socioeconomic History   Marital status: Married    Spouse name: Not on file   Number of children: Not on file    Years of education: Not on file   Highest education level: Not on file  Occupational History   Not on file  Tobacco Use   Smoking status: Never   Smokeless tobacco: Never  Vaping Use   Vaping status: Never Used  Substance and Sexual Activity   Alcohol use: Yes    Alcohol/week: 1.0 standard drink of alcohol    Types: 1 Glasses of wine per week    Comment: once per week   Drug use: No   Sexual activity: Not Currently    Birth control/protection: Surgical, I.U.D.    Comment: 1st intercourse 43 yo-Fewer than 5 partners  Other Topics Concern   Not on file  Social History Narrative   Not on file   Social Drivers of Health   Financial Resource Strain: Not on file  Food Insecurity: Not on file  Transportation Needs: Not on file  Physical Activity: Not on file  Stress: Not on file  Social Connections: Not on file  Intimate Partner Violence: Not on file   Family History  Problem Relation Age of Onset   Hypertension Mother    Breast cancer Mother 19   Diabetes Father    Breast cancer Paternal Aunt 69   Breast cancer Paternal Aunt 50   No Known Allergies   PE Today's Vitals   05/21/24 1053  BP: 122/80  Pulse: ROLLEN)  107  Temp: 98.1 F (36.7 C)  TempSrc: Oral  SpO2: 97%  Weight: 228 lb (103.4 kg)  Height: 5' 7 (1.702 m)   Body mass index is 35.71 kg/m.  Physical Exam Vitals reviewed. Exam conducted with a chaperone present.  Constitutional:      General: She is not in acute distress.    Appearance: Normal appearance.  HENT:     Head: Normocephalic and atraumatic.     Nose: Nose normal.  Eyes:     Extraocular Movements: Extraocular movements intact.     Conjunctiva/sclera: Conjunctivae normal.  Neck:     Thyroid : No thyroid  mass, thyromegaly or thyroid  tenderness.  Pulmonary:     Effort: Pulmonary effort is normal.  Chest:     Chest wall: No mass or tenderness.  Breasts:    Right: Normal. No swelling, mass, nipple discharge, skin change or tenderness.      Left: Normal. No swelling, mass, nipple discharge, skin change or tenderness.  Abdominal:     General: There is no distension.     Palpations: Abdomen is soft.     Tenderness: There is no abdominal tenderness.  Genitourinary:    General: Normal vulva.     Exam position: Lithotomy position.     Urethra: No prolapse.     Vagina: Normal. No vaginal discharge or bleeding.     Cervix: Normal. No lesion.     Uterus: Normal. Not enlarged and not tender.      Adnexa: Right adnexa normal and left adnexa normal.     Comments: IUD strings not seen Musculoskeletal:        General: Normal range of motion.     Cervical back: Normal range of motion.  Lymphadenopathy:     Upper Body:     Right upper body: No axillary adenopathy.     Left upper body: No axillary adenopathy.     Lower Body: No right inguinal adenopathy. No left inguinal adenopathy.  Skin:    General: Skin is warm and dry.  Neurological:     General: No focal deficit present.     Mental Status: She is alert.  Psychiatric:        Mood and Affect: Mood normal.        Behavior: Behavior normal.       Assessment and Plan:        Well woman exam with routine gynecological exam Assessment & Plan: Cervical cancer screening performed according to ASCCP guidelines. Encouraged annual mammogram screening Colonoscopy N/A  Labs and immunizations with her primary Encouraged safe sexual practices as indicated Encouraged healthy lifestyle practices with diet and exercise For patients under 50yo, I recommend 1000mg  calcium daily and 600IU of vitamin D  daily.   Orders: -     VITAMIN D  25 Hydroxy (Vit-D Deficiency, Fractures) -     Thyroid  Panel With TSH -     Hemoglobin A1C w/out eAG -     CBC -     COMPLETE METABOLIC PANEL WITHOUT GFR -     Lipid panel  Cervical cancer screening -     Cytology - PAP  Screening for depression  Family history of breast cancer -     Ambulatory referral to Genetics  Uses hormone releasing  intrauterine device (IUD) for contraception  Strings not seen Desires removal w/o replacement Discussed would recommend replacement if anemia is present  Vera LULLA Pa, MD

## 2024-05-21 NOTE — Patient Instructions (Signed)

## 2024-05-22 LAB — CBC
HCT: 39.3 % (ref 35.0–45.0)
Hemoglobin: 12.8 g/dL (ref 11.7–15.5)
MCH: 28 pg (ref 27.0–33.0)
MCHC: 32.6 g/dL (ref 32.0–36.0)
MCV: 86 fL (ref 80.0–100.0)
MPV: 12 fL (ref 7.5–12.5)
Platelets: 278 Thousand/uL (ref 140–400)
RBC: 4.57 Million/uL (ref 3.80–5.10)
RDW: 14.9 % (ref 11.0–15.0)
WBC: 6.7 Thousand/uL (ref 3.8–10.8)

## 2024-05-22 LAB — COMPLETE METABOLIC PANEL WITHOUT GFR
AG Ratio: 1.4 (calc) (ref 1.0–2.5)
ALT: 13 U/L (ref 6–29)
AST: 16 U/L (ref 10–30)
Albumin: 4.2 g/dL (ref 3.6–5.1)
Alkaline phosphatase (APISO): 57 U/L (ref 31–125)
BUN: 12 mg/dL (ref 7–25)
CO2: 22 mmol/L (ref 20–32)
Calcium: 9.1 mg/dL (ref 8.6–10.2)
Chloride: 106 mmol/L (ref 98–110)
Creat: 0.89 mg/dL (ref 0.50–0.99)
Globulin: 2.9 g/dL (ref 1.9–3.7)
Glucose, Bld: 88 mg/dL (ref 65–99)
Potassium: 4.1 mmol/L (ref 3.5–5.3)
Sodium: 139 mmol/L (ref 135–146)
Total Bilirubin: 0.3 mg/dL (ref 0.2–1.2)
Total Protein: 7.1 g/dL (ref 6.1–8.1)

## 2024-05-22 LAB — VITAMIN D 25 HYDROXY (VIT D DEFICIENCY, FRACTURES): Vit D, 25-Hydroxy: 20 ng/mL — ABNORMAL LOW (ref 30–100)

## 2024-05-22 LAB — LIPID PANEL
Cholesterol: 167 mg/dL (ref <200)
HDL: 49 mg/dL — ABNORMAL LOW (ref >=50)
LDL Cholesterol (Calc): 94 mg/dL
Non-HDL Cholesterol (Calc): 118 mg/dL (ref <130)
Total CHOL/HDL Ratio: 3.4 (calc) (ref <5.0)
Triglycerides: 143 mg/dL (ref <150)

## 2024-05-22 LAB — THYROID PANEL WITH TSH
Free Thyroxine Index: 2 (ref 1.4–3.8)
T4, Total: 5.6 ug/dL (ref 5.1–11.9)
TSH: 1.69 m[IU]/L
TSH: 35 m[IU]/L (ref 22–35)

## 2024-05-22 LAB — HEMOGLOBIN A1C: Hgb A1c MFr Bld: 6.2 % — ABNORMAL HIGH (ref <5.7)

## 2024-05-24 LAB — CYTOLOGY - PAP
Comment: NEGATIVE
Comment: NEGATIVE
Comment: NEGATIVE
Diagnosis: NEGATIVE
HPV 16: NEGATIVE
HPV 18 / 45: NEGATIVE
High risk HPV: POSITIVE — AB

## 2024-05-25 ENCOUNTER — Ambulatory Visit: Payer: Self-pay | Admitting: Nurse Practitioner

## 2024-05-25 ENCOUNTER — Other Ambulatory Visit: Payer: Self-pay | Admitting: Nurse Practitioner

## 2024-05-25 DIAGNOSIS — R7303 Prediabetes: Secondary | ICD-10-CM

## 2024-05-25 DIAGNOSIS — E559 Vitamin D deficiency, unspecified: Secondary | ICD-10-CM

## 2024-05-25 MED ORDER — VITAMIN D (ERGOCALCIFEROL) 1.25 MG (50000 UNIT) PO CAPS: 50000.0000 [IU] | ORAL_CAPSULE | ORAL | 0 refills | Status: AC

## 2024-08-08 ENCOUNTER — Encounter: Payer: Self-pay | Admitting: Dietician

## 2024-08-08 ENCOUNTER — Encounter: Attending: Nurse Practitioner | Admitting: Dietician

## 2024-08-08 VITALS — Wt 227.6 lb

## 2024-08-08 DIAGNOSIS — R7303 Prediabetes: Secondary | ICD-10-CM | POA: Insufficient documentation

## 2024-08-08 NOTE — Patient Instructions (Signed)
 Goals Established by Pt  Goal 1: look into a gym with group exercise classes and go 3 days a week.   Goal 2: eat your packed lunch at least 3 days per week.

## 2024-08-08 NOTE — Progress Notes (Signed)
 Medical Nutrition Therapy  Appointment Start time:  1535  Appointment End time:  1630  Primary concerns: prediabetes/preventing diabetes   Referral diagnosis: prediabetes Preferred learning style: no preference indicated Learning readiness: ready   NUTRITION ASSESSMENT   Anthropometrics   Wt 08/08/24: 227.6 lb  Clinical Medical Hx: prediabetes, anemia, vitamin d  deficiency Medications: reviewed Labs: 05/21/24: vitamin d  20, A1c 6.2% Notable Signs/Symptoms: none reported Food Allergies: none reported  Lifestyle & Dietary Hx  Pt reports in August she had her annual exam and noticed her A1c was higher than before, which worried her as she has diabetes on both sides of the family, including her father and both grandparents.   Pt reports she has been under high stress over the last few years. 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 Pt reports this is a high stress job. Along with this, pt states she is currently doing her PhD and is working on her dissertation and should be finished in May 2026. Pt reports also going through a separation causing some stress. Pt states she was seeing a counselor over the summer before school started back and found it helped but the counselor moved and she has not gotten a new one.   Pt reports she typically gets up at 5am, and gets home at 6pm. Pt states she eats breakfast some days. Pt reports she does not eat all day, and cooks dinner at home (typically a balanced meal). Pt reports she has been meal prepping and packing herself a lunch with protein, veggies, and sweet potato, but she does not eat it due to being busy. Pt reports she rarely has lunch. Pt states she may snack on whatever is around if anything (chips, donut, candy).   Pt reports she got a stationary bike for her garage and over the summer was doing it every other day and felt good, but slowly fell out of the habit and has not done it the  last 1-2 months. Pt reports she may join a group exercise class instead.    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  As evidenced by pt report.   NUTRITION INTERVENTION  Nutrition education (E-1) on the following topics:   Prediabetes Prediabetes: Prediabetes is a condition where blood sugar levels are higher than normal but not yet high enough to be diagnosed as type 2 diabetes. A1C, or hemoglobin A1c, is a blood test that provides an average of a person's blood sugar levels over the past two to three months. It is commonly used to diagnose and monitor diabetes. For prediabetes, an A1C level between 5.7% and 6.4% typically is used to diagnose this. Here is how the A1C levels are generally categorized: Normal:  A1C below 5.7% Prediabetes:  A1C between 5.7% and 6.4% Diabetes:  A1C of 6.5% or higher When diagnosed with prediabetes, there are several lifestyle changes you can make to manage the condition: Healthy Eating:  Follow a well-balanced diet that includes  a variety of fruits, vegetables, whole grains, lean proteins, and healthy fats. Monitor portion sizes and reduce intake of sugary and processed foods. Regular Physical Activity:  Engage in regular physical activity, such as brisk walking, cycling, or other aerobic exercises, for at least 150 minutes per week. Include strength training exercises at least twice a week. Weight Management: Achieve and  maintain a healthy weight. Losing even a small amount of weight (3-5%) can significantly improve insulin sensitivity.  Plate Method Food Groups Fruits & Vegetables: Aim to fill half your plate with a variety of fruits and vegetables. They are rich in vitamins, minerals, and fiber, and can help reduce the risk of chronic diseases. Choose a colorful assortment of fruits and vegetables to ensure you get a wide range of nutrients. Grains and Starches: Make at least half of your grain choices whole grains, such as brown rice, whole wheat bread, and oats. Whole grains provide fiber, which aids in digestion and healthy cholesterol levels. Aim for whole forms of starchy vegetables such as potatoes, sweet potatoes, beans, peas, and corn, which are fiber rich and provide many vitamins and minerals.  Protein: Incorporate lean sources of protein, such as poultry, fish, beans, nuts, and seeds, into your meals. Protein is essential for building and repairing tissues, staying full, balancing blood sugar, as well as supporting immune function. Dairy: Include low-fat or fat-free dairy products like milk, yogurt, and cheese in your diet. Dairy foods are excellent sources of calcium and vitamin D , which are crucial for bone health.   Handouts Provided Include  Smoothie Ideas Plate Method  Learning Style & Readiness for Change Teaching method utilized: Visual & Auditory  Demonstrated degree of understanding via: Teach Back  Barriers to learning/adherence to lifestyle change: none  Goals Established by Pt  Goal 1: look into a gym with group exercise classes and go 3 days a week.   Goal 2: eat your packed lunch at least 3 days per week.    MONITORING & EVALUATION Dietary intake, weekly physical activity, and follow up in 6-8 weeks.  Next Steps  Patient is to call for questions.

## 2024-08-27 ENCOUNTER — Inpatient Hospital Stay: Attending: Family Medicine

## 2024-08-27 ENCOUNTER — Inpatient Hospital Stay: Admitting: Genetic Counselor

## 2024-09-26 ENCOUNTER — Encounter: Admitting: Dietician

## 2024-10-23 ENCOUNTER — Encounter: Payer: Self-pay | Admitting: Genetic Counselor

## 2024-10-23 ENCOUNTER — Inpatient Hospital Stay

## 2024-10-23 ENCOUNTER — Inpatient Hospital Stay: Attending: Family Medicine | Admitting: Genetic Counselor

## 2024-10-23 DIAGNOSIS — Z1379 Encounter for other screening for genetic and chromosomal anomalies: Secondary | ICD-10-CM | POA: Diagnosis not present

## 2024-10-23 DIAGNOSIS — Z803 Family history of malignant neoplasm of breast: Secondary | ICD-10-CM

## 2024-10-23 NOTE — Progress Notes (Signed)
 REFERRING PROVIDER: Dallie Vera GAILS, MD [address] [place],  [place] [address]  PRIMARY PROVIDER:  Kip Righter, MD  PRIMARY REASON FOR VISIT:  1. Family history of malignant neoplasm of breast     HISTORY OF PRESENT ILLNESS:   Ms. Birks, a 44 y.o. female, was seen for a [facility] genetics consultation at the request of Dallie Vera GAILS, MD due to a family history of cancer.  Ms. Lute presents to clinic today to discuss the possibility of a hereditary predisposition to cancer, to discuss genetic testing, and to further clarify her future cancer risks, as well as potential cancer risks for family members.   Ms. Leber is a 44 y.o. female with no personal history of cancer.    RELEVANT MEDICAL HISTORY:  Menarche was at age 44.  First live birth at age 30.  Ovaries intact: yes.  Uterus intact: yes.  Menopausal status: premenopausal.  HRT use: 0 years. Colonoscopy: no; not examined. Mammogram within the last year: yes. Category C Number of breast biopsies: 2. 2018, ectatic duct    Past Medical History:  Diagnosis Date   Anemia     Past Surgical History:  Procedure Laterality Date   CESAREAN SECTION     INTRAUTERINE DEVICE INSERTION  07/30/2019   Mirena    TUBAL LIGATION      Social History   Socioeconomic History   Marital status: Married    Spouse name: Not on file   Number of children: Not on file   Years of education: Not on file   Highest education level: Not on file  Occupational History   Not on file  Tobacco Use   Smoking status: Never   Smokeless tobacco: Never  Vaping Use   Vaping status: Never Used  Substance and Sexual Activity   Alcohol use: Yes    Alcohol/week: 1.0 standard drink of alcohol    Types: 1 Glasses of wine per week    Comment: once per week   Drug use: No   Sexual activity: Not Currently    Birth control/protection: Surgical, I.U.D.    Comment: 1st intercourse 44 yo-Fewer than 5 partners  Other  Topics Concern   Not on file  Social History Narrative   Not on file   Social Drivers of Health   Tobacco Use: Low Risk (08/08/2024)   Patient History    Smoking Tobacco Use: Never    Smokeless Tobacco Use: Never    Passive Exposure: Not on file  Financial Resource Strain: Not on file  Food Insecurity: Not on file  Transportation Needs: Not on file  Physical Activity: Not on file  Stress: Not on file  Social Connections: Not on file  Depression (PHQ2-9): Low Risk (05/21/2024)   Depression (PHQ2-9)    PHQ-2 Score: 0  Alcohol Screen: Not on file  Housing: Not on file  Utilities: Not on file  Health Literacy: Not on file     FAMILY HISTORY:  We obtained a detailed, 4-generation family history.  Significant diagnoses are listed below: Family History  Problem Relation Age of Onset   Hypertension Mother    Breast cancer Mother 85   Diabetes Father    Breast cancer Paternal Aunt 33   Breast cancer Paternal Aunt 39    Ms. Spainhower is unaware of previous family history of genetic testing for hereditary cancer risks There is no reported Ashkenazi Jewish ancestry.      GENETIC COUNSELING ASSESSMENT: Ms. Laumann is a 44  y.o. female with a family history of cancer which is somewhat suggestive of a hereditary predisposition to cancer given her family history of two paternal second degree relatives diagnosed with breast cancer at or under 50. We, therefore, discussed and recommended the following at today's visit.   DISCUSSION: We discussed that 5 - 10% of cancer is hereditary, with most cases of hereditary breast  cancer associated with pathogenic variants in BRCA1/2.  There are other genes that can be associated with hereditary breast cancer syndromes.  We discussed that testing is beneficial for several reasons, including knowing about other cancer risks, identifying potential screening and risk-reduction options that may be appropriate, and to understanding if other family members  could be at risk for cancer and allowing them to undergo genetic testing.  We reviewed the characteristics, features and inheritance patterns of hereditary cancer syndromes. We also discussed genetic testing, including the appropriate family members to test, the process of testing, insurance coverage and turn-around-time for results. We discussed the implications of a negative, positive, carrier and/or variant of uncertain significant result. We discussed that negative results would be uninformative given that Ms. Bortner does not have a personal history of cancer. We recommended Ms. Record pursue genetic testing for a panel that contains genes associated with breast cancer.  Ms. Maxham was offered a common hereditary cancer panel (40+ genes) and an expanded pan-cancer panel (70+ genes). Ms. Guise was informed of the benefits and limitations of each panel, including that expanded pan-cancer panels contain several genes that do not have clear management guidelines at this point in time.  We also discussed that as the number of genes included on a panel increases, the chances of variants of uncertain significance increases.  Ms. Talkington expressed interest in [facility]'s Common Hereditary Cancer +RNA panel.   Based on Ms. Kearse's family history of cancer, she meets medical criteria for genetic testing. Though Ms. Bramel is not personally affected, there are no affected family members that are willing/able to undergo hereditary cancer testing. Despite that she meets criteria, she may still have an out of pocket cost. We discussed that if her out of pocket cost for testing is over $100, the laboratory should contact them to discuss self-pay prices, patient pay assistance programs, if applicable, and other billing options. Per [facility] cost estimator tool, cost $750.00 out of pocket. Provided with information on financial assistance.   We discussed that some people do not want to undergo genetic testing due to  fear of genetic discrimination.  A federal law called the Genetic Information Non-Discrimination Act (GINA) of 2008 helps protect individuals against genetic discrimination based on their genetic test results.  It impacts both health insurance and employment.  With health insurance, it protects against increased premiums, being kicked off insurance or being forced to take a test in order to be insured.  For employment it protects against hiring, firing and promoting decisions based on genetic test results.  GINA does not apply to those in the [employer], those who work for companies with less than 15 employees, and new life insurance or long-term disability insurance policies.  Health status due to a cancer diagnosis is not protected under GINA.  Ms. Kafer has been determined to be at high risk for breast cancer. her Tyrer-Cuzick risk score is 26%.  For women with a greater than 20% lifetime risk of breast cancer, the [employer] (NCCN) recommends the following:   Clinical encounter every 6-12 months to begin when identified as being  at increased risk, but not before age 69  Annual mammograms. Tomosynthesis is recommended starting 10 years earlier than the youngest breast cancer diagnosis in the family or at age 43 (whichever comes first), but not before age 56   Annual breast MRI starting 10 years earlier than the youngest breast cancer diagnosis in the family or at age 46 (whichever comes first), but not before age 71.   We, therefore, discussed that it is reasonable for Ms. Boileau to be followed by a high-risk breast cancer clinic; in addition to a yearly mammogram and physical exam by a healthcare provider, she should discuss the usefulness of an annual breast MRI with the high-risk clinic providers.   PLAN: After considering the risks, benefits, and limitations,  Ms. Vantuyl did not wish to pursue genetic testing at today's visit. We understand this decision and remain  available to coordinate genetic testing at any time in the future. We, therefore, recommend Ms. Buechner continue to follow the cancer screening guidelines given by her primary healthcare provider.  Based on Ms. Villatoro's family history, we recommended her paternal aunt and her mother, have genetic counseling and testing. Ms. Shedrick will let us  know if we can be of any assistance in coordinating genetic counseling and/or testing for this family member.   Referral to High Risk Breast Program at [facility].   Lastly, we encouraged Ms. Lumbert to remain in contact with cancer genetics annually so that we can continuously update the family history and inform her of any changes in cancer genetics and testing that may be of benefit for this family.   Ms. Ferrera questions were answered to her satisfaction today. Our contact information was provided should additional questions or concerns arise. Thank you for the referral and allowing us  to share in the care of your patient.   Burnard Ogren, MS, [facility] Licensed, [email or web address] phone: [phone]   40 minutes were spent on the date of the encounter in service to the patient including preparation, face-to-face consultation, documentation and care coordination.  The patient was seen alone.  Drs. Gudena and/or Lanny were available to discuss this case as needed.  _______________________________________________________________________ For Office Staff:  Number of people involved in session: 1 Was an Intern/ student involved with case: no

## 2025-05-23 ENCOUNTER — Ambulatory Visit: Admitting: Obstetrics and Gynecology
